# Patient Record
Sex: Female | Born: 1960 | Race: Black or African American | Hispanic: No | Marital: Married | Smoking: Never smoker
Health system: Southern US, Community
[De-identification: ages and names within clinical notes are randomized; demographics above are authoritative.]

## PROBLEM LIST (undated history)

## (undated) DIAGNOSIS — T7840XA Allergy, unspecified, initial encounter: Secondary | ICD-10-CM

## (undated) DIAGNOSIS — I10 Essential (primary) hypertension: Secondary | ICD-10-CM

## (undated) HISTORY — PX: HYSTEROSCOPY: SHX211

## (undated) HISTORY — PX: COLONOSCOPY: SHX174

## (undated) HISTORY — DX: Allergy, unspecified, initial encounter: T78.40XA

## (undated) HISTORY — DX: Essential (primary) hypertension: I10

---

## 1998-06-30 ENCOUNTER — Other Ambulatory Visit: Admission: RE | Admit: 1998-06-30 | Discharge: 1998-06-30 | Payer: Self-pay | Admitting: Gynecology

## 1999-09-23 ENCOUNTER — Other Ambulatory Visit: Admission: RE | Admit: 1999-09-23 | Discharge: 1999-09-23 | Payer: Self-pay | Admitting: Gynecology

## 2000-11-26 ENCOUNTER — Other Ambulatory Visit: Admission: RE | Admit: 2000-11-26 | Discharge: 2000-11-26 | Payer: Self-pay | Admitting: Gynecology

## 2002-02-10 ENCOUNTER — Other Ambulatory Visit: Admission: RE | Admit: 2002-02-10 | Discharge: 2002-02-10 | Payer: Self-pay | Admitting: Gynecology

## 2002-03-19 ENCOUNTER — Encounter: Admission: RE | Admit: 2002-03-19 | Discharge: 2002-03-19 | Payer: Self-pay | Admitting: Gynecology

## 2002-03-19 ENCOUNTER — Encounter: Payer: Self-pay | Admitting: Gynecology

## 2003-04-28 ENCOUNTER — Other Ambulatory Visit: Admission: RE | Admit: 2003-04-28 | Discharge: 2003-04-28 | Payer: Self-pay | Admitting: Gynecology

## 2003-12-07 ENCOUNTER — Emergency Department (HOSPITAL_COMMUNITY): Admission: EM | Admit: 2003-12-07 | Discharge: 2003-12-07 | Payer: Self-pay | Admitting: Emergency Medicine

## 2004-05-03 ENCOUNTER — Other Ambulatory Visit: Admission: RE | Admit: 2004-05-03 | Discharge: 2004-05-03 | Payer: Self-pay | Admitting: Gynecology

## 2004-11-21 ENCOUNTER — Encounter: Admission: RE | Admit: 2004-11-21 | Discharge: 2004-11-21 | Payer: Self-pay | Admitting: Internal Medicine

## 2004-11-25 ENCOUNTER — Ambulatory Visit (HOSPITAL_COMMUNITY): Admission: RE | Admit: 2004-11-25 | Discharge: 2004-11-25 | Payer: Self-pay | Admitting: Gynecology

## 2005-11-13 ENCOUNTER — Other Ambulatory Visit: Admission: RE | Admit: 2005-11-13 | Discharge: 2005-11-13 | Payer: Self-pay | Admitting: Gynecology

## 2005-11-30 ENCOUNTER — Encounter: Admission: RE | Admit: 2005-11-30 | Discharge: 2005-11-30 | Payer: Self-pay | Admitting: Gynecology

## 2006-09-20 ENCOUNTER — Encounter: Admission: RE | Admit: 2006-09-20 | Discharge: 2006-09-20 | Payer: Self-pay | Admitting: Orthopedic Surgery

## 2007-01-20 ENCOUNTER — Emergency Department (HOSPITAL_COMMUNITY): Admission: EM | Admit: 2007-01-20 | Discharge: 2007-01-20 | Payer: Self-pay | Admitting: Emergency Medicine

## 2007-03-14 ENCOUNTER — Encounter: Admission: RE | Admit: 2007-03-14 | Discharge: 2007-03-14 | Payer: Self-pay | Admitting: Gynecology

## 2007-06-10 ENCOUNTER — Other Ambulatory Visit: Admission: RE | Admit: 2007-06-10 | Discharge: 2007-06-10 | Payer: Self-pay | Admitting: Gynecology

## 2007-09-19 ENCOUNTER — Encounter (INDEPENDENT_AMBULATORY_CARE_PROVIDER_SITE_OTHER): Payer: Self-pay | Admitting: *Deleted

## 2007-09-19 ENCOUNTER — Ambulatory Visit (HOSPITAL_COMMUNITY): Admission: RE | Admit: 2007-09-19 | Discharge: 2007-09-19 | Payer: Self-pay | Admitting: *Deleted

## 2008-05-01 ENCOUNTER — Encounter: Admission: RE | Admit: 2008-05-01 | Discharge: 2008-05-01 | Payer: Self-pay | Admitting: Gynecology

## 2008-06-10 ENCOUNTER — Other Ambulatory Visit: Admission: RE | Admit: 2008-06-10 | Discharge: 2008-06-10 | Payer: Self-pay | Admitting: Gynecology

## 2009-08-27 ENCOUNTER — Emergency Department (HOSPITAL_COMMUNITY): Admission: EM | Admit: 2009-08-27 | Discharge: 2009-08-27 | Payer: Self-pay | Admitting: Family Medicine

## 2010-01-16 ENCOUNTER — Emergency Department (HOSPITAL_COMMUNITY): Admission: EM | Admit: 2010-01-16 | Discharge: 2010-01-16 | Payer: Self-pay | Admitting: Emergency Medicine

## 2010-10-07 ENCOUNTER — Encounter (INDEPENDENT_AMBULATORY_CARE_PROVIDER_SITE_OTHER): Payer: Self-pay | Admitting: *Deleted

## 2010-10-13 ENCOUNTER — Ambulatory Visit: Payer: Self-pay | Admitting: Internal Medicine

## 2010-10-25 ENCOUNTER — Ambulatory Visit: Payer: Self-pay | Admitting: Internal Medicine

## 2010-12-25 ENCOUNTER — Encounter: Payer: Self-pay | Admitting: General Surgery

## 2011-01-03 NOTE — Letter (Signed)
Summary: [facility] Instructions  [facility]  [address] [place], [place] [address]   Phone: [phone]  Fax: [phone]       Denise Shelton    January 13, 1961    MRN: [id number]        Procedure Day /Date: Tuesday 10-25-10     Arrival Time: 2:00 p.m.     Procedure Time: 3:00 p.m.     Location of Procedure:                    _x _  [facility] (4th Floor)                       PREPARATION FOR COLONOSCOPY WITH MOVIPREP   Starting 5 days prior to your procedure  10-20-10 do not eat nuts, seeds, popcorn, corn, beans, peas,  salads, or any raw vegetables.  Do not take any fiber supplements (e.g. Metamucil, Citrucel, and Benefiber).  THE DAY BEFORE YOUR PROCEDURE         DATE:  10-24-10   DAY:  Monday  1.  Drink clear liquids the entire day-NO SOLID FOOD  2.  Do not drink anything colored red or purple.  Avoid juices with pulp.  No orange juice.  3.  Drink at least 64 oz. (8 glasses) of fluid/clear liquids during the day to prevent dehydration and help the prep work efficiently.  CLEAR LIQUIDS INCLUDE: Water Jello Ice Popsicles Tea (sugar ok, no milk/cream) Powdered fruit flavored drinks Coffee (sugar ok, no milk/cream) Gatorade Juice: apple, white grape, white cranberry  Lemonade Clear bullion, consomm, broth Carbonated beverages (any kind) Strained chicken noodle soup Hard Candy                             4.  In the morning, mix first dose of MoviPrep solution:    Empty 1 Pouch A and 1 Pouch B into the disposable container    Add lukewarm drinking water to the top line of the container. Mix to dissolve    Refrigerate (mixed solution should be used within 24 hrs)  5.  Begin drinking the prep at 5:00 p.m. The MoviPrep container is divided by 4 marks.   Every 15 minutes drink the solution down to the next mark (approximately 8 oz) until the full liter is complete.   6.  Follow completed prep with 16 oz of clear liquid of your choice  (Nothing red or purple).  Continue to drink clear liquids until bedtime.  7.  Before going to bed, mix second dose of MoviPrep solution:    Empty 1 Pouch A and 1 Pouch B into the disposable container    Add lukewarm drinking water to the top line of the container. Mix to dissolve    Refrigerate  THE DAY OF YOUR PROCEDURE      DATE:  10-25-10  DAY:  Tuesday  Beginning at  10:00 a.m. (5 hours before procedure):         1. Every 15 minutes, drink the solution down to the next mark (approx 8 oz) until the full liter is complete.  2. Follow completed prep with 16 oz. of clear liquid of your choice.    3. You may drink clear liquids until  1:00 p.m.  (2 HOURS BEFORE PROCEDURE).   MEDICATION INSTRUCTIONS  Unless otherwise instructed, you should take regular prescription medications with a small sip of water  as early as possible the morning of your procedure.           OTHER INSTRUCTIONS  You will need a responsible adult at least 52 years of age to accompany you and drive you home.   This person must remain in the waiting room during your procedure.  Wear loose fitting clothing that is easily removed.  Leave jewelry and other valuables at home.  However, you may wish to bring a book to read or  an iPod/MP3 player to listen to music as you wait for your procedure to start.  Remove all body piercing jewelry and leave at home.  Total time from sign-in until discharge is approximately 2-3 hours.  You should go home directly after your procedure and rest.  You can resume normal activities the  day after your procedure.  The day of your procedure you should not:   Drive   Make legal decisions   Operate machinery   Drink alcohol   Return to work  You will receive specific instructions about eating, activities and medications before you leave.    The above instructions have been reviewed and explained to me by   Clide Cliff, RN_____________________    I  fully understand and can verbalize these instructions _____________________________ Date _________

## 2011-01-03 NOTE — Miscellaneous (Signed)
Summary: DIR COL...AS.  Clinical Lists Changes  Medications: Added new medication of MOVIPREP 100 GM SOLR (PEG-KCL-NACL-NASULF-NA ASC-C) As directed - Signed Rx of MOVIPREP 100 GM SOLR (PEG-KCL-NACL-NASULF-NA ASC-C) As directed;  #1 x 0;  Signed;  Entered by: Clide Cliff RN;  Authorized by: Hilarie Fredrickson MD;  Method used: Electronically to [facility] # [id number]*, [address] [place], [place], [place]  [address], Ph: [phone] or [phone], Fax: [phone] Allergies: Added new allergy or adverse reaction of ULTRAM Observations: Added new observation of NKA: F (10/13/2010 16:19)    Prescriptions: MOVIPREP 100 GM SOLR (PEG-KCL-NACL-NASULF-NA ASC-C) As directed  #1 x 0   Entered by:   Clide Cliff RN   Authorized by:   Hilarie Fredrickson MD   Signed by:   Clide Cliff RN on 10/13/2010   Method used:   Electronically to        [id number] (retail)       [address]       [place], [place]  [address]       Ph: [phone] or [phone]       Fax: [phone]   RxID:   [id number]

## 2011-01-03 NOTE — Letter (Signed)
Summary: Pre Visit Letter Revised  [facility]  [address] [place], [place] [address]   Phone: [phone]  Fax: [phone]        10/07/2010 MRN: [id number] [facility] [place] [address] [place], [place]  [address]             Procedure Date:  10/25/2010   Welcome to the Gastroenterology Division at [facility].    You are scheduled to see a nurse for your pre-procedure visit on 10/13/2010 at 4:30PM on the 3rd floor at [facility], [address]  We ask that you try to arrive at our office 15 minutes prior to your appointment time to allow for check-in.  Please take a minute to review the attached form.  If you answer "Yes" to one or more of the questions on the first page, we ask that you call the person listed at your earliest opportunity.  If you answer "No" to all of the questions, please complete the rest of the form and bring it to your appointment.    Your nurse visit will consist of discussing your medical and surgical history, your immediate family medical history, and your medications.   If you are unable to list all of your medications on the form, please bring the medication bottles to your appointment and we will list them.  We will need to be aware of both prescribed and over the counter drugs.  We will need to know exact dosage information as well.    Please be prepared to read and sign documents such as consent forms, a financial agreement, and acknowledgement forms.  If necessary, and with your consent, a friend or relative is welcome to sit-in on the nurse visit with you.  Please bring your insurance card so that we may make a copy of it.  If your insurance requires a referral to see a specialist, please bring your referral form from your primary care physician.  No co-pay is required for this nurse visit.     If you cannot keep your appointment, please call [phone] to cancel or reschedule prior to your appointment date.  This allows  [place] the opportunity to schedule an appointment for another patient in need of care.    Thank you for choosing [facility] for your medical needs.  We appreciate the opportunity to care for you.  Please visit [place] at our website  to learn more about our practice.  Sincerely, The Gastroenterology Division

## 2011-01-03 NOTE — Procedures (Signed)
Summary: Colonoscopy  Patient: [name] Note: All result statuses are Final unless otherwise noted.  Tests: (1) Colonoscopy (COL)   COL Colonoscopy           DONE     [facility]     [address]     [place], [place]  [address]           COLONOSCOPY PROCEDURE REPORT           PATIENT:  Denise, Shelton  MR#:  [id number]     BIRTHDATE:  1961/11/17, 49 yrs. old  GENDER:  female     ENDOSCOPIST:  Wilhemina Bonito. Eda Keys, MD     REF. BY:  Creola Corn, M.D.     PROCEDURE DATE:  10/25/2010     PROCEDURE:  Average-risk screening colonoscopy     G0121     ASA CLASS:  Class II     INDICATIONS:  Routine Risk Screening     MEDICATIONS:   Fentanyl 50 mcg IV, Versed 7 mg IV, Benadryl 25 mg     IV           DESCRIPTION OF PROCEDURE:   After the risks benefits and     alternatives of the procedure were thoroughly explained, informed     consent was obtained.  Digital rectal exam was performed and     revealed no abnormalities.   The [id number] endoscope was     introduced through the anus and advanced to the cecum, which was     identified by both the appendix and ileocecal valve, without     limitations.Time to cecum = 3:32 min.  The quality of the prep was     excellent, using MoviPrep.  The instrument was then slowly     withdrawn (time = 11:58 min) as the colon was fully examined.     <<PROCEDUREIMAGES>>           FINDINGS:  Scattered diverticula were found in the ascending     colon.  This was otherwise a normal examination of the colon.  No     polyps or cancers were seen.   Retroflexed views in the rectum     revealed no abnormalities.    The scope was then withdrawn from     the patient and the procedure completed.           COMPLICATIONS:  None           ENDOSCOPIC IMPRESSION:     1) Diverticula, scattered in the ascending colon     2) Otherwise normal examination     3) No polyps or cancers     RECOMMENDATIONS:     1) Continue current colorectal screening  recommendations for     "routine risk" patients with a repeat colonoscopy in 10 years.           ______________________________     Wilhemina Bonito. Eda Keys, MD           CC:  Creola Corn, MD;Howard Mezer, MD;The Patient           n.     Rosalie DoctorWilhemina Bonito. Eda Keys at 10/25/2010 03:46 PM           Florencia Reasons, [phone]  Note: An exclamation mark (!) indicates a result that was not dispersed into the flowsheet. Document Creation Date: 10/25/2010 3:47 PM _______________________________________________________________________  (1) Order result status: Final Collection or observation date-time: 10/25/2010 15:41 Requested  date-time:  Receipt date-time:  Reported date-time:  Referring Physician:   Ordering Physician: Fransico Setters [id number]) Specimen Source:  Source: Launa Grill Order Number: [id number] Lab site:   Appended Document: Colonoscopy    Clinical Lists Changes  Observations: Added new observation of COLONNXTDUE: 10/2020 (10/26/2010 7:23)

## 2011-04-18 NOTE — Op Note (Signed)
Denise Shelton, Denise Shelton                 ACCOUNT NO.:  [id number]   MEDICAL RECORD NO.:  [id number]          PATIENT TYPE:  AMB   LOCATION:  SDC                           FACILITY:  [facility]   PHYSICIAN:  Almedia Balls. Fore, M.D.   DATE OF BIRTH:  1961/07/22   DATE OF PROCEDURE:  09/19/2007  DATE OF DISCHARGE:                               OPERATIVE REPORT   PREOPERATIVE DIAGNOSIS:  Abnormal uterine bleeding, uterine enlargement,  question endometrial polyp.   POSTOPERATIVE DIAGNOSIS:  Abnormal uterine bleeding, uterine  enlargement, question endometrial polyp, pending pathology.   OPERATIONS:  Diagnostic hysteroscopy, fractional D&C.   ANESTHESIA:  MAC, with 10 mL 1% lidocaine paracervical block.   OPERATOR:  Almedia Balls. Randell Patient, M.D.   INDICATIONS FOR SURGERY:  This patient is a 50 year old with the above-  noted problems who has been counseled by Dr. Chevis Pretty and myself to have  the surgery noted above.  She has been fully counseled as to the nature  of the procedure and the risks involved, to include risks of anesthesia,  injury to uterus, ovaries, bowel, bladder, blood vessels, ureters,  postoperative hemorrhage, infection, and recuperation.  She fully  understands all these considerations and has signed informed consent to  proceed on September 19, 2007.   OPERATIVE FINDINGS:  On bimanual exam, the uterus was midposterior  approximately, 14-[redacted] weeks gestational size.  It was irregular.  There  were no palpable adnexal masses.  On hysteroscopy, the uterus sounded to  12.5 cm.  The endocervical canal had a small amount of shaggy tissue  present.  The endometrial cavity was irregular, with probable submucous  myomata and several shaggy appearing areas suggestive of polypoid  formation.   PROCEDURE:  With the patient under sedation, prepared and draped in  usual sterile fashion, a speculum was placed in the vagina.  The  anterior lip of the cervix was grasped with a single-tooth tenaculum,  and a  solution of 1% lidocaine was injected at the 2, 4, 8, and 10  o'clock positions for a total of 10 mL for paracervical block.  A small  sharp curette was used for curettage of the endocervical canal, with a  small amount of normal-appearing tissue being obtained.  The uterus was  sounded, as noted above, and the cervix was dilated up to a #21 Pratt  dilator.  Diagnostic hysteroscope was introduced using free flow of  [place] and direct vision, with the above-noted findings.  Hysteroscope  was removed, and a medium sharp curette and polyp forceps were used for  removal of tissue from the endometrial cavity.  The hysteroscope was re-  employed to ensure that all tissue had been removed.  After noting that  this was the case and that sponge and instrument counts were correct,  and hemostasis maintained, the procedure was terminated.  Estimated  blood loss:  Less than 25 mL.  The patient was taken to the recovery  room in good condition.   FOLLOW-UP CARE:  She is to return the office in approximately 2 weeks  for follow-up, and to call if heavy  bleeding, pain, or unexplained fever  should ensue.           ______________________________  Almedia Balls. Randell Patient, M.D.     SRF/MEDQ  D:  09/19/2007  T:  09/20/2007  Job:  [id number]   cc:   Leatha Gilding. Mezer, M.D.  Fax: [phone]

## 2011-08-02 ENCOUNTER — Other Ambulatory Visit: Payer: Self-pay | Admitting: Gynecology

## 2011-09-13 LAB — CBC
HCT: 37.2
MCHC: 34.2
MCV: 91.9
Platelets: 271
WBC: 7.5

## 2012-10-01 ENCOUNTER — Other Ambulatory Visit: Payer: Self-pay | Admitting: Gynecology

## 2013-10-21 ENCOUNTER — Other Ambulatory Visit: Payer: Self-pay | Admitting: Gynecology

## 2014-10-22 ENCOUNTER — Other Ambulatory Visit: Payer: Self-pay | Admitting: Gynecology

## 2014-10-27 LAB — CYTOLOGY - PAP

## 2015-02-03 ENCOUNTER — Other Ambulatory Visit: Payer: Self-pay | Admitting: Gynecology

## 2015-11-11 ENCOUNTER — Other Ambulatory Visit: Payer: Self-pay | Admitting: Obstetrics and Gynecology

## 2015-11-15 LAB — CYTOLOGY - PAP

## 2016-01-06 DIAGNOSIS — M5417 Radiculopathy, lumbosacral region: Secondary | ICD-10-CM | POA: Diagnosis not present

## 2016-01-06 DIAGNOSIS — M545 Low back pain: Secondary | ICD-10-CM | POA: Diagnosis not present

## 2016-01-23 ENCOUNTER — Ambulatory Visit (INDEPENDENT_AMBULATORY_CARE_PROVIDER_SITE_OTHER): Payer: 59 | Admitting: Physician Assistant

## 2016-01-23 VITALS — BP 116/68 | HR 84 | Temp 98.4°F | Resp 16 | Ht 65.5 in | Wt 145.0 lb

## 2016-01-23 DIAGNOSIS — M109 Gout, unspecified: Secondary | ICD-10-CM

## 2016-01-23 DIAGNOSIS — M1009 Idiopathic gout, multiple sites: Secondary | ICD-10-CM | POA: Diagnosis not present

## 2016-01-23 DIAGNOSIS — M7989 Other specified soft tissue disorders: Secondary | ICD-10-CM | POA: Diagnosis not present

## 2016-01-23 LAB — POCT CBC
Granulocyte percent: 50.5 %G (ref 37–80)
HCT, POC: 39.8 % (ref 37.7–47.9)
Hemoglobin: 13.5 g/dL (ref 12.2–16.2)
Lymph, poc: 2.4 (ref 0.6–3.4)
MCH: 30.7 pg (ref 27–31.2)
MCHC: 33.9 g/dL (ref 31.8–35.4)
MCV: 90.6 fL (ref 80–97)
MID (CBC): 0.2 (ref 0–0.9)
MPV: 7 fL (ref 0–99.8)
POC Granulocyte: 2.7 (ref 2–6.9)
POC LYMPH %: 44.8 % (ref 10–50)
POC MID %: 4.7 % (ref 0–12)
Platelet Count, POC: 263 10*3/uL (ref 142–424)
RBC: 4.39 M/uL (ref 4.04–5.48)
RDW, POC: 14.1 %
WBC: 5.3 10*3/uL (ref 4.6–10.2)

## 2016-01-23 LAB — GLUCOSE, POCT (MANUAL RESULT ENTRY): POC GLUCOSE: 93 mg/dL (ref 70–99)

## 2016-01-23 LAB — POCT SEDIMENTATION RATE: POCT SED RATE: 24 mm/hr — AB (ref 0–22)

## 2016-01-23 MED ORDER — PREDNISONE 20 MG PO TABS: ORAL_TABLET | ORAL | Status: DC

## 2016-01-23 NOTE — Patient Instructions (Addendum)
Limit fish and shellfish for now.  Limit red meat Stay hydrated. Urine should be pale yellow. Don't take aleve while on prednisone Prednisone 3 tabs for 3 days, 2 tabs for 3 days and then 1 tab for 3 days. I will call you with the results of your lab tests.   Gout Gout is an inflammatory arthritis caused by a buildup of uric acid crystals in the joints. Uric acid is a chemical that is normally present in the blood. When the level of uric acid in the blood is too high it can form crystals that deposit in your joints and tissues. This causes joint redness, soreness, and swelling (inflammation). Repeat attacks are common. Over time, uric acid crystals can form into masses (tophi) near a joint, destroying bone and causing disfigurement. Gout is treatable and often preventable. CAUSES  The disease begins with elevated levels of uric acid in the blood. Uric acid is produced by your body when it breaks down a naturally found substance called purines. Certain foods you eat, such as meats and fish, contain high amounts of purines. Causes of an elevated uric acid level include:  Being passed down from parent to child (heredity).  Diseases that cause increased uric acid production (such as obesity, psoriasis, and certain cancers).  Excessive alcohol use.  Diet, especially diets rich in meat and seafood.  Medicines, including certain cancer-fighting medicines (chemotherapy), water pills (diuretics), and aspirin.  Chronic kidney disease. The kidneys are no longer able to remove uric acid well.  Problems with metabolism. Conditions strongly associated with gout include:  Obesity.  High blood pressure.  High cholesterol.  Diabetes. Not everyone with elevated uric acid levels gets gout. It is not understood why some people get gout and others do not. Surgery, joint injury, and eating too much of certain foods are some of the factors that can lead to gout attacks. SYMPTOMS   An attack of gout  comes on quickly. It causes intense pain with redness, swelling, and warmth in a joint.  Fever can occur.  Often, only one joint is involved. Certain joints are more commonly involved:  Base of the big toe.  Knee.  Ankle.  Wrist.  Finger. Without treatment, an attack usually goes away in a few days to weeks. Between attacks, you usually will not have symptoms, which is different from many other forms of arthritis. DIAGNOSIS  Your caregiver will suspect gout based on your symptoms and exam. In some cases, tests may be recommended. The tests may include:  Blood tests.  Urine tests.  X-rays.  Joint fluid exam. This exam requires a needle to remove fluid from the joint (arthrocentesis). Using a microscope, gout is confirmed when uric acid crystals are seen in the joint fluid. TREATMENT  There are two phases to gout treatment: treating the sudden onset (acute) attack and preventing attacks (prophylaxis).  Treatment of an Acute Attack.  Medicines are used. These include anti-inflammatory medicines or steroid medicines.  An injection of steroid medicine into the affected joint is sometimes necessary.  The painful joint is rested. Movement can worsen the arthritis.  You may use warm or cold treatments on painful joints, depending which works best for you.  Treatment to Prevent Attacks.  If you suffer from frequent gout attacks, your caregiver may advise preventive medicine. These medicines are started after the acute attack subsides. These medicines either help your kidneys eliminate uric acid from your body or decrease your uric acid production. You may need to stay on  these medicines for a very long time.  The early phase of treatment with preventive medicine can be associated with an increase in acute gout attacks. For this reason, during the first few months of treatment, your caregiver may also advise you to take medicines usually used for acute gout treatment. Be sure you  understand your caregiver's directions. Your caregiver may make several adjustments to your medicine dose before these medicines are effective.  Discuss dietary treatment with your caregiver or dietitian. Alcohol and drinks high in sugar and fructose and foods such as meat, poultry, and seafood can increase uric acid levels. Your caregiver or dietitian can advise you on drinks and foods that should be limited. HOME CARE INSTRUCTIONS   Do not take aspirin to relieve pain. This raises uric acid levels.  Only take over-the-counter or prescription medicines for pain, discomfort, or fever as directed by your caregiver.  Rest the joint as much as possible. When in bed, keep sheets and blankets off painful areas.  Keep the affected joint raised (elevated).  Apply warm or cold treatments to painful joints. Use of warm or cold treatments depends on which works best for you.  Use crutches if the painful joint is in your leg.  Drink enough fluids to keep your urine clear or pale yellow. This helps your body get rid of uric acid. Limit alcohol, sugary drinks, and fructose drinks.  Follow your dietary instructions. Pay careful attention to the amount of protein you eat. Your daily diet should emphasize fruits, vegetables, whole grains, and fat-free or low-fat milk products. Discuss the use of coffee, vitamin C, and cherries with your caregiver or dietitian. These may be helpful in lowering uric acid levels.  Maintain a healthy body weight. SEEK MEDICAL CARE IF:   You develop diarrhea, vomiting, or any side effects from medicines.  You do not feel better in 24 hours, or you are getting worse. SEEK IMMEDIATE MEDICAL CARE IF:   Your joint becomes suddenly more tender, and you have chills or a fever. MAKE SURE YOU:   Understand these instructions.  Will watch your condition.  Will get help right away if you are not doing well or get worse.   This information is not intended to replace advice  given to you by your health care provider. Make sure you discuss any questions you have with your health care provider.   Document Released: 11/17/2000 Document Revised: 12/11/2014 Document Reviewed: 07/03/2012 Elsevier Interactive Patient Education [employer].

## 2016-01-23 NOTE — Progress Notes (Signed)
[facility] [address] [place] [address] [phone]  Date:  01/23/2016   Name:  Denise Shelton   DOB:  04-Aug-1961   MRN:  [id number]  PCP:  No primary care provider on file.    Chief Complaint: Hand Pain   History of Present Illness:  This is a 55 y.o. female who is presenting with right index finger pain and swelling x 24 hours. Pain over PIP joint of that finger. Finding difficult to flex d/t pain and tightness. Denies fever or chills. Never had anything like this happen before. Does not have a hx of gout although her mother has a hx of gout. Pt states she eats very healthy. Did have more shellfish than normal this past week. She does not eat red meat. She does not drink alcohol. No trauma.  She tried ibuprofen for the pain and was minimally effective.  Review of Systems:  Review of Systems See HPI  There are no active problems to display for this patient.   Prior to Admission medications   Medication Sig Start Date End Date Taking? Authorizing Provider  Naproxen Sodium (ALEVE PO) Take by mouth.   Yes Historical Provider, MD    Allergies  Allergen Reactions  . Latex   . Tramadol Hcl     REACTION: Nausea and Dizziness    Past Surgical History  Procedure Laterality Date  . Cesarean section      Social History  Substance Use Topics  . Smoking status: Never Smoker   . Smokeless tobacco: None  . Alcohol Use: None    Family History  Problem Relation Age of Onset  . Diabetes Mother   . Hypertension Mother   . Diabetes Father   . Hypertension Father   . Hyperlipidemia Father     Medication list has been reviewed and updated.  Physical Examination:  Physical Exam  Constitutional: She is oriented to person, place, and time. She appears well-developed and well-nourished. No distress.  HENT:  Head: Normocephalic and atraumatic.  Right Ear: Hearing normal.  Left Ear: Hearing normal.  Nose: Nose normal.  Eyes: Conjunctivae and lids  are normal. Right eye exhibits no discharge. Left eye exhibits no discharge. No scleral icterus.  Cardiovascular: Normal rate, regular rhythm, normal heart sounds and normal pulses.   No murmur heard. Pulmonary/Chest: Effort normal and breath sounds normal. No respiratory distress. She has no wheezes. She has no rhonchi. She has no rales.  Musculoskeletal:       Right hand: She exhibits decreased range of motion (mildly decreased flexion of PIP of index finger), tenderness (index finger, PIP), bony tenderness and swelling. She exhibits normal capillary refill. Normal sensation noted. Normal strength noted.       Left hand: Normal.  Erythema over PIP of index finger. No pain with light touch Mild warmth  Neurological: She is alert and oriented to person, place, and time.  Skin: Skin is warm, dry and intact.  Psychiatric: She has a normal mood and affect. Her speech is normal and behavior is normal. Thought content normal.    BP 116/68 mmHg  Pulse 84  Temp(Src) 98.4 F (36.9 C)  Resp 16  Ht 5' 5.5" (1.664 m)  Wt 145 lb (65.772 kg)  BMI 23.75 kg/m2  Results for orders placed or performed in visit on 01/23/16  POCT CBC  Result Value Ref Range   WBC 5.3 4.6 - 10.2 K/uL   Lymph, poc 2.4 0.6 - 3.4  POC LYMPH PERCENT 44.8 10 - 50 %L   MID (cbc) 0.2 0 - 0.9   POC MID % 4.7 0 - 12 %M   POC Granulocyte 2.7 2 - 6.9   Granulocyte percent 50.5 37 - 80 %G   RBC 4.39 4.04 - 5.48 M/uL   Hemoglobin 13.5 12.2 - 16.2 g/dL   HCT, POC 39.8 37.7 - 47.9 %   MCV 90.6 80 - 97 fL   MCH, POC 30.7 27 - 31.2 pg   MCHC 33.9 31.8 - 35.4 g/dL   RDW, POC 14.1 %   Platelet Count, POC 263 142 - 424 K/uL   MPV 7.0 0 - 99.8 fL   Assessment and Plan:  1. Finger swelling 2. Gout  Suspect gout as cause. CBC wnl. Sed rate and uric acid pending. No hx DM and glucose wnl. Treat with prednisone taper. We discussed diet changes. Sounds like gout could be hereditary. Runs in family. Return in 7-10 days if symptoms  do not improve or at any time if symptoms worsen.  - POCT CBC - POCT SEDIMENTATION RATE - Uric Acid - POCT glucose (manual entry) - predniSONE (DELTASONE) 20 MG tablet; Take 3 PO QAM x3days, 2 PO QAM x3days, 1 PO QAM x3days  Dispense: 18 tablet; Refill: 0   Benjaman Pott. Drenda Freeze, MHS [facility]  01/23/2016

## 2016-01-24 LAB — URIC ACID: URIC ACID, SERUM: 3.6 mg/dL (ref 2.4–7.0)

## 2016-02-10 DIAGNOSIS — M9901 Segmental and somatic dysfunction of cervical region: Secondary | ICD-10-CM | POA: Diagnosis not present

## 2016-02-10 DIAGNOSIS — Q72812 Congenital shortening of left lower limb: Secondary | ICD-10-CM | POA: Diagnosis not present

## 2016-02-10 DIAGNOSIS — M9905 Segmental and somatic dysfunction of pelvic region: Secondary | ICD-10-CM | POA: Diagnosis not present

## 2016-02-10 DIAGNOSIS — M50321 Other cervical disc degeneration at C4-C5 level: Secondary | ICD-10-CM | POA: Diagnosis not present

## 2016-02-10 DIAGNOSIS — M9903 Segmental and somatic dysfunction of lumbar region: Secondary | ICD-10-CM | POA: Diagnosis not present

## 2016-02-10 DIAGNOSIS — M9904 Segmental and somatic dysfunction of sacral region: Secondary | ICD-10-CM | POA: Diagnosis not present

## 2016-02-10 DIAGNOSIS — M4316 Spondylolisthesis, lumbar region: Secondary | ICD-10-CM | POA: Diagnosis not present

## 2016-02-10 DIAGNOSIS — M9902 Segmental and somatic dysfunction of thoracic region: Secondary | ICD-10-CM | POA: Diagnosis not present

## 2016-02-10 DIAGNOSIS — M4003 Postural kyphosis, cervicothoracic region: Secondary | ICD-10-CM | POA: Diagnosis not present

## 2016-02-17 DIAGNOSIS — M4003 Postural kyphosis, cervicothoracic region: Secondary | ICD-10-CM | POA: Diagnosis not present

## 2016-02-17 DIAGNOSIS — Q72812 Congenital shortening of left lower limb: Secondary | ICD-10-CM | POA: Diagnosis not present

## 2016-02-17 DIAGNOSIS — M9905 Segmental and somatic dysfunction of pelvic region: Secondary | ICD-10-CM | POA: Diagnosis not present

## 2016-02-17 DIAGNOSIS — M9903 Segmental and somatic dysfunction of lumbar region: Secondary | ICD-10-CM | POA: Diagnosis not present

## 2016-02-17 DIAGNOSIS — M50321 Other cervical disc degeneration at C4-C5 level: Secondary | ICD-10-CM | POA: Diagnosis not present

## 2016-02-17 DIAGNOSIS — M9901 Segmental and somatic dysfunction of cervical region: Secondary | ICD-10-CM | POA: Diagnosis not present

## 2016-02-17 DIAGNOSIS — M9904 Segmental and somatic dysfunction of sacral region: Secondary | ICD-10-CM | POA: Diagnosis not present

## 2016-02-17 DIAGNOSIS — M9902 Segmental and somatic dysfunction of thoracic region: Secondary | ICD-10-CM | POA: Diagnosis not present

## 2016-02-17 DIAGNOSIS — M4316 Spondylolisthesis, lumbar region: Secondary | ICD-10-CM | POA: Diagnosis not present

## 2016-02-21 DIAGNOSIS — M9902 Segmental and somatic dysfunction of thoracic region: Secondary | ICD-10-CM | POA: Diagnosis not present

## 2016-02-21 DIAGNOSIS — M4316 Spondylolisthesis, lumbar region: Secondary | ICD-10-CM | POA: Diagnosis not present

## 2016-02-21 DIAGNOSIS — M9903 Segmental and somatic dysfunction of lumbar region: Secondary | ICD-10-CM | POA: Diagnosis not present

## 2016-02-21 DIAGNOSIS — Q72812 Congenital shortening of left lower limb: Secondary | ICD-10-CM | POA: Diagnosis not present

## 2016-02-21 DIAGNOSIS — M9904 Segmental and somatic dysfunction of sacral region: Secondary | ICD-10-CM | POA: Diagnosis not present

## 2016-02-21 DIAGNOSIS — M4003 Postural kyphosis, cervicothoracic region: Secondary | ICD-10-CM | POA: Diagnosis not present

## 2016-02-21 DIAGNOSIS — M9905 Segmental and somatic dysfunction of pelvic region: Secondary | ICD-10-CM | POA: Diagnosis not present

## 2016-02-21 DIAGNOSIS — M9901 Segmental and somatic dysfunction of cervical region: Secondary | ICD-10-CM | POA: Diagnosis not present

## 2016-02-21 DIAGNOSIS — M50321 Other cervical disc degeneration at C4-C5 level: Secondary | ICD-10-CM | POA: Diagnosis not present

## 2016-02-24 DIAGNOSIS — M9903 Segmental and somatic dysfunction of lumbar region: Secondary | ICD-10-CM | POA: Diagnosis not present

## 2016-02-24 DIAGNOSIS — M4316 Spondylolisthesis, lumbar region: Secondary | ICD-10-CM | POA: Diagnosis not present

## 2016-02-24 DIAGNOSIS — M9902 Segmental and somatic dysfunction of thoracic region: Secondary | ICD-10-CM | POA: Diagnosis not present

## 2016-02-24 DIAGNOSIS — M50321 Other cervical disc degeneration at C4-C5 level: Secondary | ICD-10-CM | POA: Diagnosis not present

## 2016-02-24 DIAGNOSIS — M9905 Segmental and somatic dysfunction of pelvic region: Secondary | ICD-10-CM | POA: Diagnosis not present

## 2016-02-24 DIAGNOSIS — M9904 Segmental and somatic dysfunction of sacral region: Secondary | ICD-10-CM | POA: Diagnosis not present

## 2016-02-24 DIAGNOSIS — M9901 Segmental and somatic dysfunction of cervical region: Secondary | ICD-10-CM | POA: Diagnosis not present

## 2016-02-24 DIAGNOSIS — M4003 Postural kyphosis, cervicothoracic region: Secondary | ICD-10-CM | POA: Diagnosis not present

## 2016-02-24 DIAGNOSIS — Q72812 Congenital shortening of left lower limb: Secondary | ICD-10-CM | POA: Diagnosis not present

## 2016-02-28 DIAGNOSIS — M50321 Other cervical disc degeneration at C4-C5 level: Secondary | ICD-10-CM | POA: Diagnosis not present

## 2016-02-28 DIAGNOSIS — M9901 Segmental and somatic dysfunction of cervical region: Secondary | ICD-10-CM | POA: Diagnosis not present

## 2016-02-28 DIAGNOSIS — M9904 Segmental and somatic dysfunction of sacral region: Secondary | ICD-10-CM | POA: Diagnosis not present

## 2016-02-28 DIAGNOSIS — Q72812 Congenital shortening of left lower limb: Secondary | ICD-10-CM | POA: Diagnosis not present

## 2016-02-28 DIAGNOSIS — M4003 Postural kyphosis, cervicothoracic region: Secondary | ICD-10-CM | POA: Diagnosis not present

## 2016-02-28 DIAGNOSIS — M9905 Segmental and somatic dysfunction of pelvic region: Secondary | ICD-10-CM | POA: Diagnosis not present

## 2016-02-28 DIAGNOSIS — M4316 Spondylolisthesis, lumbar region: Secondary | ICD-10-CM | POA: Diagnosis not present

## 2016-02-28 DIAGNOSIS — M9903 Segmental and somatic dysfunction of lumbar region: Secondary | ICD-10-CM | POA: Diagnosis not present

## 2016-02-28 DIAGNOSIS — M9902 Segmental and somatic dysfunction of thoracic region: Secondary | ICD-10-CM | POA: Diagnosis not present

## 2016-03-02 DIAGNOSIS — M9902 Segmental and somatic dysfunction of thoracic region: Secondary | ICD-10-CM | POA: Diagnosis not present

## 2016-03-02 DIAGNOSIS — M9901 Segmental and somatic dysfunction of cervical region: Secondary | ICD-10-CM | POA: Diagnosis not present

## 2016-03-02 DIAGNOSIS — M4003 Postural kyphosis, cervicothoracic region: Secondary | ICD-10-CM | POA: Diagnosis not present

## 2016-03-02 DIAGNOSIS — M9905 Segmental and somatic dysfunction of pelvic region: Secondary | ICD-10-CM | POA: Diagnosis not present

## 2016-03-02 DIAGNOSIS — Q72812 Congenital shortening of left lower limb: Secondary | ICD-10-CM | POA: Diagnosis not present

## 2016-03-02 DIAGNOSIS — M50321 Other cervical disc degeneration at C4-C5 level: Secondary | ICD-10-CM | POA: Diagnosis not present

## 2016-03-02 DIAGNOSIS — M9904 Segmental and somatic dysfunction of sacral region: Secondary | ICD-10-CM | POA: Diagnosis not present

## 2016-03-02 DIAGNOSIS — M9903 Segmental and somatic dysfunction of lumbar region: Secondary | ICD-10-CM | POA: Diagnosis not present

## 2016-03-02 DIAGNOSIS — M4316 Spondylolisthesis, lumbar region: Secondary | ICD-10-CM | POA: Diagnosis not present

## 2016-03-08 DIAGNOSIS — M47812 Spondylosis without myelopathy or radiculopathy, cervical region: Secondary | ICD-10-CM | POA: Diagnosis not present

## 2016-03-08 DIAGNOSIS — M47816 Spondylosis without myelopathy or radiculopathy, lumbar region: Secondary | ICD-10-CM | POA: Diagnosis not present

## 2016-03-09 DIAGNOSIS — Q72812 Congenital shortening of left lower limb: Secondary | ICD-10-CM | POA: Diagnosis not present

## 2016-03-09 DIAGNOSIS — M4316 Spondylolisthesis, lumbar region: Secondary | ICD-10-CM | POA: Diagnosis not present

## 2016-03-09 DIAGNOSIS — M9901 Segmental and somatic dysfunction of cervical region: Secondary | ICD-10-CM | POA: Diagnosis not present

## 2016-03-09 DIAGNOSIS — M9905 Segmental and somatic dysfunction of pelvic region: Secondary | ICD-10-CM | POA: Diagnosis not present

## 2016-03-09 DIAGNOSIS — M9903 Segmental and somatic dysfunction of lumbar region: Secondary | ICD-10-CM | POA: Diagnosis not present

## 2016-03-09 DIAGNOSIS — M9902 Segmental and somatic dysfunction of thoracic region: Secondary | ICD-10-CM | POA: Diagnosis not present

## 2016-03-09 DIAGNOSIS — M50321 Other cervical disc degeneration at C4-C5 level: Secondary | ICD-10-CM | POA: Diagnosis not present

## 2016-03-09 DIAGNOSIS — M4003 Postural kyphosis, cervicothoracic region: Secondary | ICD-10-CM | POA: Diagnosis not present

## 2016-03-09 DIAGNOSIS — M9904 Segmental and somatic dysfunction of sacral region: Secondary | ICD-10-CM | POA: Diagnosis not present

## 2016-03-16 DIAGNOSIS — Q72812 Congenital shortening of left lower limb: Secondary | ICD-10-CM | POA: Diagnosis not present

## 2016-03-16 DIAGNOSIS — M9902 Segmental and somatic dysfunction of thoracic region: Secondary | ICD-10-CM | POA: Diagnosis not present

## 2016-03-16 DIAGNOSIS — M4316 Spondylolisthesis, lumbar region: Secondary | ICD-10-CM | POA: Diagnosis not present

## 2016-03-16 DIAGNOSIS — M9903 Segmental and somatic dysfunction of lumbar region: Secondary | ICD-10-CM | POA: Diagnosis not present

## 2016-03-16 DIAGNOSIS — M9904 Segmental and somatic dysfunction of sacral region: Secondary | ICD-10-CM | POA: Diagnosis not present

## 2016-03-16 DIAGNOSIS — M9901 Segmental and somatic dysfunction of cervical region: Secondary | ICD-10-CM | POA: Diagnosis not present

## 2016-03-16 DIAGNOSIS — M4003 Postural kyphosis, cervicothoracic region: Secondary | ICD-10-CM | POA: Diagnosis not present

## 2016-03-16 DIAGNOSIS — M9905 Segmental and somatic dysfunction of pelvic region: Secondary | ICD-10-CM | POA: Diagnosis not present

## 2016-03-16 DIAGNOSIS — M50321 Other cervical disc degeneration at C4-C5 level: Secondary | ICD-10-CM | POA: Diagnosis not present

## 2016-03-30 DIAGNOSIS — M9902 Segmental and somatic dysfunction of thoracic region: Secondary | ICD-10-CM | POA: Diagnosis not present

## 2016-03-30 DIAGNOSIS — M4003 Postural kyphosis, cervicothoracic region: Secondary | ICD-10-CM | POA: Diagnosis not present

## 2016-03-30 DIAGNOSIS — M9903 Segmental and somatic dysfunction of lumbar region: Secondary | ICD-10-CM | POA: Diagnosis not present

## 2016-03-30 DIAGNOSIS — M9905 Segmental and somatic dysfunction of pelvic region: Secondary | ICD-10-CM | POA: Diagnosis not present

## 2016-03-30 DIAGNOSIS — M9904 Segmental and somatic dysfunction of sacral region: Secondary | ICD-10-CM | POA: Diagnosis not present

## 2016-03-30 DIAGNOSIS — Q72812 Congenital shortening of left lower limb: Secondary | ICD-10-CM | POA: Diagnosis not present

## 2016-03-30 DIAGNOSIS — M50321 Other cervical disc degeneration at C4-C5 level: Secondary | ICD-10-CM | POA: Diagnosis not present

## 2016-03-30 DIAGNOSIS — M4316 Spondylolisthesis, lumbar region: Secondary | ICD-10-CM | POA: Diagnosis not present

## 2016-03-30 DIAGNOSIS — M9901 Segmental and somatic dysfunction of cervical region: Secondary | ICD-10-CM | POA: Diagnosis not present

## 2016-04-06 DIAGNOSIS — M9901 Segmental and somatic dysfunction of cervical region: Secondary | ICD-10-CM | POA: Diagnosis not present

## 2016-04-06 DIAGNOSIS — Q72812 Congenital shortening of left lower limb: Secondary | ICD-10-CM | POA: Diagnosis not present

## 2016-04-06 DIAGNOSIS — M9903 Segmental and somatic dysfunction of lumbar region: Secondary | ICD-10-CM | POA: Diagnosis not present

## 2016-04-06 DIAGNOSIS — M4316 Spondylolisthesis, lumbar region: Secondary | ICD-10-CM | POA: Diagnosis not present

## 2016-04-06 DIAGNOSIS — M50321 Other cervical disc degeneration at C4-C5 level: Secondary | ICD-10-CM | POA: Diagnosis not present

## 2016-04-06 DIAGNOSIS — M9902 Segmental and somatic dysfunction of thoracic region: Secondary | ICD-10-CM | POA: Diagnosis not present

## 2016-04-06 DIAGNOSIS — M9905 Segmental and somatic dysfunction of pelvic region: Secondary | ICD-10-CM | POA: Diagnosis not present

## 2016-04-06 DIAGNOSIS — M4003 Postural kyphosis, cervicothoracic region: Secondary | ICD-10-CM | POA: Diagnosis not present

## 2016-04-06 DIAGNOSIS — M9904 Segmental and somatic dysfunction of sacral region: Secondary | ICD-10-CM | POA: Diagnosis not present

## 2016-05-04 DIAGNOSIS — M9904 Segmental and somatic dysfunction of sacral region: Secondary | ICD-10-CM | POA: Diagnosis not present

## 2016-05-04 DIAGNOSIS — M4003 Postural kyphosis, cervicothoracic region: Secondary | ICD-10-CM | POA: Diagnosis not present

## 2016-05-04 DIAGNOSIS — M4316 Spondylolisthesis, lumbar region: Secondary | ICD-10-CM | POA: Diagnosis not present

## 2016-05-04 DIAGNOSIS — M9901 Segmental and somatic dysfunction of cervical region: Secondary | ICD-10-CM | POA: Diagnosis not present

## 2016-05-04 DIAGNOSIS — M9903 Segmental and somatic dysfunction of lumbar region: Secondary | ICD-10-CM | POA: Diagnosis not present

## 2016-05-04 DIAGNOSIS — Q72812 Congenital shortening of left lower limb: Secondary | ICD-10-CM | POA: Diagnosis not present

## 2016-05-04 DIAGNOSIS — M9905 Segmental and somatic dysfunction of pelvic region: Secondary | ICD-10-CM | POA: Diagnosis not present

## 2016-05-04 DIAGNOSIS — M50321 Other cervical disc degeneration at C4-C5 level: Secondary | ICD-10-CM | POA: Diagnosis not present

## 2016-05-04 DIAGNOSIS — M9902 Segmental and somatic dysfunction of thoracic region: Secondary | ICD-10-CM | POA: Diagnosis not present

## 2016-06-01 DIAGNOSIS — Q72812 Congenital shortening of left lower limb: Secondary | ICD-10-CM | POA: Diagnosis not present

## 2016-06-01 DIAGNOSIS — M50321 Other cervical disc degeneration at C4-C5 level: Secondary | ICD-10-CM | POA: Diagnosis not present

## 2016-06-01 DIAGNOSIS — M9903 Segmental and somatic dysfunction of lumbar region: Secondary | ICD-10-CM | POA: Diagnosis not present

## 2016-06-01 DIAGNOSIS — M9905 Segmental and somatic dysfunction of pelvic region: Secondary | ICD-10-CM | POA: Diagnosis not present

## 2016-06-01 DIAGNOSIS — M9904 Segmental and somatic dysfunction of sacral region: Secondary | ICD-10-CM | POA: Diagnosis not present

## 2016-06-01 DIAGNOSIS — M4316 Spondylolisthesis, lumbar region: Secondary | ICD-10-CM | POA: Diagnosis not present

## 2016-06-01 DIAGNOSIS — M9902 Segmental and somatic dysfunction of thoracic region: Secondary | ICD-10-CM | POA: Diagnosis not present

## 2016-06-01 DIAGNOSIS — M4003 Postural kyphosis, cervicothoracic region: Secondary | ICD-10-CM | POA: Diagnosis not present

## 2016-06-01 DIAGNOSIS — M9901 Segmental and somatic dysfunction of cervical region: Secondary | ICD-10-CM | POA: Diagnosis not present

## 2016-07-05 DIAGNOSIS — Q72812 Congenital shortening of left lower limb: Secondary | ICD-10-CM | POA: Diagnosis not present

## 2016-07-05 DIAGNOSIS — M9902 Segmental and somatic dysfunction of thoracic region: Secondary | ICD-10-CM | POA: Diagnosis not present

## 2016-07-05 DIAGNOSIS — M4316 Spondylolisthesis, lumbar region: Secondary | ICD-10-CM | POA: Diagnosis not present

## 2016-07-05 DIAGNOSIS — M50321 Other cervical disc degeneration at C4-C5 level: Secondary | ICD-10-CM | POA: Diagnosis not present

## 2016-07-05 DIAGNOSIS — M9901 Segmental and somatic dysfunction of cervical region: Secondary | ICD-10-CM | POA: Diagnosis not present

## 2016-07-05 DIAGNOSIS — M9905 Segmental and somatic dysfunction of pelvic region: Secondary | ICD-10-CM | POA: Diagnosis not present

## 2016-07-05 DIAGNOSIS — M4003 Postural kyphosis, cervicothoracic region: Secondary | ICD-10-CM | POA: Diagnosis not present

## 2016-07-05 DIAGNOSIS — M9904 Segmental and somatic dysfunction of sacral region: Secondary | ICD-10-CM | POA: Diagnosis not present

## 2016-07-05 DIAGNOSIS — M9903 Segmental and somatic dysfunction of lumbar region: Secondary | ICD-10-CM | POA: Diagnosis not present

## 2016-07-25 DIAGNOSIS — M4003 Postural kyphosis, cervicothoracic region: Secondary | ICD-10-CM | POA: Diagnosis not present

## 2016-07-25 DIAGNOSIS — M50321 Other cervical disc degeneration at C4-C5 level: Secondary | ICD-10-CM | POA: Diagnosis not present

## 2016-07-25 DIAGNOSIS — M9904 Segmental and somatic dysfunction of sacral region: Secondary | ICD-10-CM | POA: Diagnosis not present

## 2016-07-25 DIAGNOSIS — Q72812 Congenital shortening of left lower limb: Secondary | ICD-10-CM | POA: Diagnosis not present

## 2016-07-25 DIAGNOSIS — M9901 Segmental and somatic dysfunction of cervical region: Secondary | ICD-10-CM | POA: Diagnosis not present

## 2016-07-25 DIAGNOSIS — M9902 Segmental and somatic dysfunction of thoracic region: Secondary | ICD-10-CM | POA: Diagnosis not present

## 2016-07-25 DIAGNOSIS — M9905 Segmental and somatic dysfunction of pelvic region: Secondary | ICD-10-CM | POA: Diagnosis not present

## 2016-07-25 DIAGNOSIS — M9903 Segmental and somatic dysfunction of lumbar region: Secondary | ICD-10-CM | POA: Diagnosis not present

## 2016-07-25 DIAGNOSIS — M4316 Spondylolisthesis, lumbar region: Secondary | ICD-10-CM | POA: Diagnosis not present

## 2016-08-04 DIAGNOSIS — E784 Other hyperlipidemia: Secondary | ICD-10-CM | POA: Diagnosis not present

## 2016-08-04 DIAGNOSIS — I10 Essential (primary) hypertension: Secondary | ICD-10-CM | POA: Diagnosis not present

## 2016-08-04 DIAGNOSIS — Z0000 Encounter for general adult medical examination without abnormal findings: Secondary | ICD-10-CM | POA: Diagnosis not present

## 2016-08-11 DIAGNOSIS — D259 Leiomyoma of uterus, unspecified: Secondary | ICD-10-CM | POA: Diagnosis not present

## 2016-08-11 DIAGNOSIS — N951 Menopausal and female climacteric states: Secondary | ICD-10-CM | POA: Diagnosis not present

## 2016-08-11 DIAGNOSIS — M79662 Pain in left lower leg: Secondary | ICD-10-CM | POA: Diagnosis not present

## 2016-08-11 DIAGNOSIS — I10 Essential (primary) hypertension: Secondary | ICD-10-CM | POA: Diagnosis not present

## 2016-08-11 DIAGNOSIS — E784 Other hyperlipidemia: Secondary | ICD-10-CM | POA: Diagnosis not present

## 2016-08-11 DIAGNOSIS — M5416 Radiculopathy, lumbar region: Secondary | ICD-10-CM | POA: Diagnosis not present

## 2016-08-11 DIAGNOSIS — M67432 Ganglion, left wrist: Secondary | ICD-10-CM | POA: Diagnosis not present

## 2016-08-11 DIAGNOSIS — Z0000 Encounter for general adult medical examination without abnormal findings: Secondary | ICD-10-CM | POA: Diagnosis not present

## 2016-08-11 DIAGNOSIS — Z87448 Personal history of other diseases of urinary system: Secondary | ICD-10-CM | POA: Diagnosis not present

## 2016-08-16 DIAGNOSIS — Z1212 Encounter for screening for malignant neoplasm of rectum: Secondary | ICD-10-CM | POA: Diagnosis not present

## 2016-09-12 ENCOUNTER — Ambulatory Visit (INDEPENDENT_AMBULATORY_CARE_PROVIDER_SITE_OTHER): Payer: 59 | Admitting: Podiatry

## 2016-09-12 ENCOUNTER — Ambulatory Visit (INDEPENDENT_AMBULATORY_CARE_PROVIDER_SITE_OTHER): Payer: 59

## 2016-09-12 ENCOUNTER — Encounter: Payer: Self-pay | Admitting: Podiatry

## 2016-09-12 ENCOUNTER — Other Ambulatory Visit: Payer: Self-pay | Admitting: *Deleted

## 2016-09-12 ENCOUNTER — Ambulatory Visit: Payer: Self-pay

## 2016-09-12 VITALS — BP 150/85 | HR 62 | Resp 16

## 2016-09-12 DIAGNOSIS — M2010 Hallux valgus (acquired), unspecified foot: Secondary | ICD-10-CM

## 2016-09-12 DIAGNOSIS — M2011 Hallux valgus (acquired), right foot: Secondary | ICD-10-CM | POA: Diagnosis not present

## 2016-09-12 DIAGNOSIS — M2012 Hallux valgus (acquired), left foot: Secondary | ICD-10-CM

## 2016-09-12 DIAGNOSIS — Q828 Other specified congenital malformations of skin: Secondary | ICD-10-CM

## 2016-09-12 NOTE — Progress Notes (Signed)
   Subjective:    Patient ID: Denise Shelton, female    DOB: Mar 22, 1961, 55 y.o.   MRN: [id number]  HPI: Pain presents today as a 55 year old female with a chief complaint of pain first metatarsophalangeal joint left greater than right. She states that the deformity has been present for years seems to be getting worse and has really just started to ache in the past 3 months. She states that the second toes are beginning to overlap and crossover she says shoes are becoming uncomfortable and she has calloused areas to the plantar aspect of the second metatarsophalangeal joints.    Review of Systems  Musculoskeletal: Positive for arthralgias and back pain.  All other systems reviewed and are negative.      Objective:   Physical Exam: Vital signs are stable she is alert and oriented 3 pleasant female in no apparent distress. Pulses are strongly palpable. Neurologic sensorium is intact. Deep tendon reflexes are intact. Muscle strength +5 over 5 dorsiflexion plantar flexors and inverters everters all edges of musculature is intact. Orthopedic evaluation of his joints all joints of the ankle full range of motion without crepitus significant hallux abductovalgus bilateral with hammertoe deformities developing. She has some tenderness on palpation and range of motion of the second metatarsophalangeal joints (right. Radiographs taken today do demonstrate an increase in the first intermetatarsal angle greater than normal value with hallux abductus angle greater than normal value and early dislocation of the first metatarsophalangeal joint. Hammertoe deformities are noted on lateral view as well. Medial deviation of the toes also noted on AP view. Cutaneous evaluation does demonstrate solitary porokeratotic lesions beneath the second metatarsal phalangeal joint of the bilateral foot these lesions are sharply dictated porokeratotic. There's no signs of infection.        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  Plan: Discussed in great detail today the need for surgical intervention as well as a second metatarsal osteotomy and first metatarsal osteotomy and hammertoe repair with pin most likely. We discussed this in great detail today we also debrided all reactive hyperkeratosis on follow-up with her in 6 weeks at which time we will have a surgical consult.

## 2016-09-14 DIAGNOSIS — H6123 Impacted cerumen, bilateral: Secondary | ICD-10-CM | POA: Diagnosis not present

## 2016-09-14 DIAGNOSIS — H6093 Unspecified otitis externa, bilateral: Secondary | ICD-10-CM | POA: Diagnosis not present

## 2016-09-14 DIAGNOSIS — J309 Allergic rhinitis, unspecified: Secondary | ICD-10-CM | POA: Diagnosis not present

## 2016-09-14 DIAGNOSIS — H9190 Unspecified hearing loss, unspecified ear: Secondary | ICD-10-CM | POA: Diagnosis not present

## 2016-10-24 ENCOUNTER — Ambulatory Visit: Payer: 59 | Admitting: Podiatry

## 2016-11-16 DIAGNOSIS — Z1231 Encounter for screening mammogram for malignant neoplasm of breast: Secondary | ICD-10-CM | POA: Diagnosis not present

## 2016-11-16 DIAGNOSIS — Z01419 Encounter for gynecological examination (general) (routine) without abnormal findings: Secondary | ICD-10-CM | POA: Diagnosis not present

## 2017-01-04 DIAGNOSIS — N950 Postmenopausal bleeding: Secondary | ICD-10-CM | POA: Diagnosis not present

## 2017-02-09 DIAGNOSIS — Z6823 Body mass index (BMI) 23.0-23.9, adult: Secondary | ICD-10-CM | POA: Diagnosis not present

## 2017-02-09 DIAGNOSIS — M2012 Hallux valgus (acquired), left foot: Secondary | ICD-10-CM | POA: Diagnosis not present

## 2017-02-09 DIAGNOSIS — N951 Menopausal and female climacteric states: Secondary | ICD-10-CM | POA: Diagnosis not present

## 2017-02-09 DIAGNOSIS — I10 Essential (primary) hypertension: Secondary | ICD-10-CM | POA: Diagnosis not present

## 2017-02-09 DIAGNOSIS — J3089 Other allergic rhinitis: Secondary | ICD-10-CM | POA: Diagnosis not present

## 2017-08-10 DIAGNOSIS — Z0000 Encounter for general adult medical examination without abnormal findings: Secondary | ICD-10-CM | POA: Diagnosis not present

## 2017-08-10 DIAGNOSIS — E784 Other hyperlipidemia: Secondary | ICD-10-CM | POA: Diagnosis not present

## 2017-08-10 DIAGNOSIS — I10 Essential (primary) hypertension: Secondary | ICD-10-CM | POA: Diagnosis not present

## 2017-08-17 DIAGNOSIS — I10 Essential (primary) hypertension: Secondary | ICD-10-CM | POA: Diagnosis not present

## 2017-08-17 DIAGNOSIS — Z0000 Encounter for general adult medical examination without abnormal findings: Secondary | ICD-10-CM | POA: Diagnosis not present

## 2017-08-17 DIAGNOSIS — N951 Menopausal and female climacteric states: Secondary | ICD-10-CM | POA: Diagnosis not present

## 2017-08-17 DIAGNOSIS — M6740 Ganglion, unspecified site: Secondary | ICD-10-CM | POA: Diagnosis not present

## 2017-08-17 DIAGNOSIS — M79662 Pain in left lower leg: Secondary | ICD-10-CM | POA: Diagnosis not present

## 2017-08-17 DIAGNOSIS — Z87448 Personal history of other diseases of urinary system: Secondary | ICD-10-CM | POA: Diagnosis not present

## 2017-08-17 DIAGNOSIS — M2012 Hallux valgus (acquired), left foot: Secondary | ICD-10-CM | POA: Diagnosis not present

## 2017-08-17 DIAGNOSIS — E784 Other hyperlipidemia: Secondary | ICD-10-CM | POA: Diagnosis not present

## 2017-08-17 DIAGNOSIS — J3089 Other allergic rhinitis: Secondary | ICD-10-CM | POA: Diagnosis not present

## 2017-08-17 DIAGNOSIS — Z1389 Encounter for screening for other disorder: Secondary | ICD-10-CM | POA: Diagnosis not present

## 2017-08-24 DIAGNOSIS — Z1212 Encounter for screening for malignant neoplasm of rectum: Secondary | ICD-10-CM | POA: Diagnosis not present

## 2017-09-03 DIAGNOSIS — J029 Acute pharyngitis, unspecified: Secondary | ICD-10-CM | POA: Diagnosis not present

## 2017-09-03 DIAGNOSIS — Z6824 Body mass index (BMI) 24.0-24.9, adult: Secondary | ICD-10-CM | POA: Diagnosis not present

## 2017-09-03 DIAGNOSIS — J111 Influenza due to unidentified influenza virus with other respiratory manifestations: Secondary | ICD-10-CM | POA: Diagnosis not present

## 2017-09-03 DIAGNOSIS — J0100 Acute maxillary sinusitis, unspecified: Secondary | ICD-10-CM | POA: Diagnosis not present

## 2017-09-03 DIAGNOSIS — H6121 Impacted cerumen, right ear: Secondary | ICD-10-CM | POA: Diagnosis not present

## 2017-09-03 DIAGNOSIS — J020 Streptococcal pharyngitis: Secondary | ICD-10-CM | POA: Diagnosis not present

## 2017-11-28 DIAGNOSIS — I10 Essential (primary) hypertension: Secondary | ICD-10-CM | POA: Diagnosis not present

## 2017-11-28 DIAGNOSIS — J0100 Acute maxillary sinusitis, unspecified: Secondary | ICD-10-CM | POA: Diagnosis not present

## 2017-11-28 DIAGNOSIS — R05 Cough: Secondary | ICD-10-CM | POA: Diagnosis not present

## 2017-11-28 DIAGNOSIS — J309 Allergic rhinitis, unspecified: Secondary | ICD-10-CM | POA: Diagnosis not present

## 2017-11-28 DIAGNOSIS — Z6825 Body mass index (BMI) 25.0-25.9, adult: Secondary | ICD-10-CM | POA: Diagnosis not present

## 2018-02-20 DIAGNOSIS — Z124 Encounter for screening for malignant neoplasm of cervix: Secondary | ICD-10-CM | POA: Diagnosis not present

## 2018-02-20 DIAGNOSIS — Z1231 Encounter for screening mammogram for malignant neoplasm of breast: Secondary | ICD-10-CM | POA: Diagnosis not present

## 2018-02-20 DIAGNOSIS — Z01419 Encounter for gynecological examination (general) (routine) without abnormal findings: Secondary | ICD-10-CM | POA: Diagnosis not present

## 2018-03-06 DIAGNOSIS — R05 Cough: Secondary | ICD-10-CM | POA: Diagnosis not present

## 2018-07-10 DIAGNOSIS — H524 Presbyopia: Secondary | ICD-10-CM | POA: Diagnosis not present

## 2018-07-10 DIAGNOSIS — H52203 Unspecified astigmatism, bilateral: Secondary | ICD-10-CM | POA: Diagnosis not present

## 2018-07-10 DIAGNOSIS — H5213 Myopia, bilateral: Secondary | ICD-10-CM | POA: Diagnosis not present

## 2018-08-23 DIAGNOSIS — Z0000 Encounter for general adult medical examination without abnormal findings: Secondary | ICD-10-CM | POA: Diagnosis not present

## 2018-08-23 DIAGNOSIS — R82998 Other abnormal findings in urine: Secondary | ICD-10-CM | POA: Diagnosis not present

## 2018-08-23 DIAGNOSIS — E7849 Other hyperlipidemia: Secondary | ICD-10-CM | POA: Diagnosis not present

## 2018-08-23 DIAGNOSIS — I10 Essential (primary) hypertension: Secondary | ICD-10-CM | POA: Diagnosis not present

## 2018-08-30 DIAGNOSIS — Z6825 Body mass index (BMI) 25.0-25.9, adult: Secondary | ICD-10-CM | POA: Diagnosis not present

## 2018-08-30 DIAGNOSIS — I10 Essential (primary) hypertension: Secondary | ICD-10-CM | POA: Diagnosis not present

## 2018-08-30 DIAGNOSIS — Z1389 Encounter for screening for other disorder: Secondary | ICD-10-CM | POA: Diagnosis not present

## 2018-08-30 DIAGNOSIS — N951 Menopausal and female climacteric states: Secondary | ICD-10-CM | POA: Diagnosis not present

## 2018-08-30 DIAGNOSIS — E7849 Other hyperlipidemia: Secondary | ICD-10-CM | POA: Diagnosis not present

## 2018-08-30 DIAGNOSIS — Z0000 Encounter for general adult medical examination without abnormal findings: Secondary | ICD-10-CM | POA: Diagnosis not present

## 2018-08-30 DIAGNOSIS — J3089 Other allergic rhinitis: Secondary | ICD-10-CM | POA: Diagnosis not present

## 2018-08-30 DIAGNOSIS — Z87448 Personal history of other diseases of urinary system: Secondary | ICD-10-CM | POA: Diagnosis not present

## 2018-09-03 DIAGNOSIS — Z1212 Encounter for screening for malignant neoplasm of rectum: Secondary | ICD-10-CM | POA: Diagnosis not present

## 2019-03-14 ENCOUNTER — Emergency Department (HOSPITAL_COMMUNITY)
Admission: EM | Admit: 2019-03-14 | Discharge: 2019-03-14 | Disposition: A | Discharge status: Home / Self Care | Payer: 59 | Attending: Emergency Medicine | Admitting: Emergency Medicine

## 2019-03-14 ENCOUNTER — Encounter (HOSPITAL_COMMUNITY): Payer: Self-pay | Admitting: Emergency Medicine

## 2019-03-14 ENCOUNTER — Emergency Department (HOSPITAL_COMMUNITY): Payer: 59

## 2019-03-14 ENCOUNTER — Other Ambulatory Visit: Payer: Self-pay

## 2019-03-14 DIAGNOSIS — R200 Anesthesia of skin: Secondary | ICD-10-CM | POA: Diagnosis not present

## 2019-03-14 DIAGNOSIS — R51 Headache: Secondary | ICD-10-CM | POA: Insufficient documentation

## 2019-03-14 DIAGNOSIS — R519 Headache, unspecified: Secondary | ICD-10-CM

## 2019-03-14 DIAGNOSIS — H538 Other visual disturbances: Secondary | ICD-10-CM | POA: Insufficient documentation

## 2019-03-14 DIAGNOSIS — I10 Essential (primary) hypertension: Secondary | ICD-10-CM | POA: Insufficient documentation

## 2019-03-14 DIAGNOSIS — M25512 Pain in left shoulder: Secondary | ICD-10-CM | POA: Diagnosis not present

## 2019-03-14 DIAGNOSIS — R079 Chest pain, unspecified: Secondary | ICD-10-CM | POA: Diagnosis not present

## 2019-03-14 DIAGNOSIS — G43909 Migraine, unspecified, not intractable, without status migrainosus: Secondary | ICD-10-CM | POA: Diagnosis not present

## 2019-03-14 DIAGNOSIS — R6884 Jaw pain: Secondary | ICD-10-CM | POA: Diagnosis not present

## 2019-03-14 LAB — I-STAT BETA HCG BLOOD, ED (MC, WL, AP ONLY): I-stat hCG, quantitative: <5 m[IU]/mL (ref <5)

## 2019-03-14 LAB — CBC
HCT: 40.9 % (ref 36.0–46.0)
Hemoglobin: 13.3 g/dL (ref 12.0–15.0)
MCH: 29.4 pg (ref 26.0–34.0)
MCHC: 32.5 g/dL (ref 30.0–36.0)
MCV: 90.5 fL (ref 80.0–100.0)
Platelets: 234 10*3/uL (ref 150–400)
RBC: 4.52 MIL/uL (ref 3.87–5.11)
RDW: 13.2 % (ref 11.5–15.5)
WBC: 6.3 10*3/uL (ref 4.0–10.5)
nRBC: 0 % (ref 0.0–0.2)

## 2019-03-14 LAB — SEDIMENTATION RATE: Sed Rate: 30 mm/hr — ABNORMAL HIGH (ref 0–22)

## 2019-03-14 LAB — BASIC METABOLIC PANEL
Anion gap: 13 (ref 5–15)
BUN: 19 mg/dL (ref 6–20)
CO2: 25 mmol/L (ref 22–32)
Calcium: 9.4 mg/dL (ref 8.9–10.3)
Chloride: 105 mmol/L (ref 98–111)
Creatinine, Ser: 0.93 mg/dL (ref 0.44–1.00)
GFR calc Af Amer: >60 mL/min (ref >60)
GFR calc non Af Amer: >60 mL/min (ref >60)
Glucose, Bld: 93 mg/dL (ref 70–99)
Potassium: 3.9 mmol/L (ref 3.5–5.1)
Sodium: 143 mmol/L (ref 135–145)

## 2019-03-14 LAB — TROPONIN I: Troponin I: <0.03 ng/mL (ref <0.03)

## 2019-03-14 MED ORDER — IOHEXOL 350 MG/ML SOLN
100.0000 mL | Freq: Once | INTRAVENOUS | Status: AC | PRN
  Administered 2019-03-14: 100 mL via INTRAVENOUS

## 2019-03-14 MED ORDER — SODIUM CHLORIDE 0.9% FLUSH
3.0000 mL | Freq: Once | INTRAVENOUS | Status: AC
  Administered 2019-03-14: 3 mL via INTRAVENOUS

## 2019-03-14 NOTE — ED Notes (Signed)
Patient transported to CT 

## 2019-03-14 NOTE — ED Provider Notes (Signed)
Emergency Department Provider Note   I have reviewed the triage vital signs and the nursing notes.   HISTORY  Chief Complaint Headache and Mouth Injury   HPI Denise Shelton is a 58 y.o. female with PMH of HTN and migraine HA presents to the emergency department evaluation of left-sided headache with pain radiating to the left shoulder and jaw.  Patient reports migraine headache yesterday typical of her migraines.  She developed some blurry vision in the left eye which is also typical for her.  She states that this headache resolved after some rest.  Today, she woke up without headache but developed a "cramping" pain in the left side of the head which radiated and behind the left ear.  She began experiencing some soreness in the left jaw and shoulder.  She states that the severe, cramping type pain lasted for only "a few moments" but has had some residual soreness.  She feels some tingling in the jaw but denies specific numbness or weakness.  No speech changes.  No vision changes with the headache today.  No symptoms in the left arm or leg.    Past Medical History:  Diagnosis Date   Hypertension     There are no active problems to display for this patient.   Past Surgical History:  Procedure Laterality Date   CESAREAN SECTION      Allergies Patient has no active allergies.  Family History  Problem Relation Age of Onset   Diabetes Mother    Hypertension Mother    Diabetes Father    Hypertension Father    Hyperlipidemia Father     Social History Social History   Tobacco Use   Smoking status: Never Smoker  Substance Use Topics   Alcohol use: Not on file   Drug use: Not on file    Review of Systems  Constitutional: No fever/chills Eyes: Blurry vision yesterday, resolved.  ENT: No sore throat. Positive tingling in the left jaw.  Cardiovascular: Denies chest pain. Respiratory: Denies shortness of breath. Gastrointestinal: No abdominal pain.  No nausea,  no vomiting.  No diarrhea.  No constipation. Genitourinary: Negative for dysuria. Musculoskeletal: Negative for back pain. Positive left shoulder soreness.  Skin: Negative for rash.  Neurological: Negative for focal weakness or numbness. Positive HA.   10-point ROS otherwise negative.  ____________________________________________   PHYSICAL EXAM:  VITAL SIGNS: ED Triage Vitals  Enc Vitals Group     BP 03/14/19 1656 (!) 180/106     Pulse Rate 03/14/19 1656 84     Resp 03/14/19 1656 16     Temp 03/14/19 1656 97.7 F (36.5 C)     Temp Source 03/14/19 1656 Oral     SpO2 03/14/19 1656 99 %     Pain Score 03/14/19 1715 5   Constitutional: Alert and oriented. Well appearing and in no acute distress. Eyes: Conjunctivae are normal. PERRL. EOMI. Head: Atraumatic. Mild tenderness over the left temporal scalp.  Nose: No congestion/rhinnorhea. Mouth/Throat: Mucous membranes are moist.  Neck: No stridor.   Cardiovascular: Normal rate, regular rhythm. Good peripheral circulation. Grossly normal heart sounds.   Respiratory: Normal respiratory effort.  No retractions. Lungs CTAB. Gastrointestinal: Soft and nontender. No distention.  Musculoskeletal: No lower extremity tenderness nor edema. No gross deformities of extremities. Neurologic:  Normal speech and language. No gross focal neurologic deficits are appreciated. No pronator drift. Skin:  Skin is warm, dry and intact. No rash noted.  ____________________________________________   LABS (all labs ordered are  listed, but only abnormal results are displayed)  Labs Reviewed  SEDIMENTATION RATE - Abnormal; Notable for the following components:      Result Value   Sed Rate 30 (*)    All other components within normal limits  BASIC METABOLIC PANEL  CBC  TROPONIN I  I-STAT BETA HCG BLOOD, ED ([facility], [facility], [facility] ONLY)   ____________________________________________  EKG  Sinus rhythm. Normal axis. Narrow QRS. No ST elevation or depression.  No STEMI. Images not crossing from MUSE.  ____________________________________________  RADIOLOGY  Ct Angio Head W Or Wo Contrast  Result Date: 03/14/2019 CLINICAL DATA:  Headache and blurred vision EXAM: CT ANGIOGRAPHY HEAD AND NECK TECHNIQUE: Multidetector CT imaging of the head and neck was performed using the standard protocol during bolus administration of intravenous contrast. Multiplanar CT image reconstructions and MIPs were obtained to evaluate the vascular anatomy. Carotid stenosis measurements (when applicable) are obtained utilizing NASCET criteria, using the distal internal carotid diameter as the denominator. CONTRAST:  130m OMNIPAQUE IOHEXOL 350 MG/ML SOLN COMPARISON:  Head CT 03/14/2019 FINDINGS: CTA NECK FINDINGS SKELETON: There is no bony spinal canal stenosis. No lytic or blastic lesion. OTHER NECK: Normal pharynx, larynx and major salivary glands. No cervical lymphadenopathy. Unremarkable thyroid gland. UPPER CHEST: No pneumothorax or pleural effusion. No nodules or masses. AORTIC ARCH: There is no calcific atherosclerosis of the aortic arch. There is no aneurysm, dissection or hemodynamically significant stenosis of the visualized ascending aorta and aortic arch. Conventional 3 vessel aortic branching pattern. The visualized proximal subclavian arteries are widely patent. RIGHT CAROTID SYSTEM: --Common carotid artery: Widely patent origin without common carotid artery dissection or aneurysm. --Internal carotid artery: Normal without aneurysm, dissection or stenosis. --External carotid artery: No acute abnormality. LEFT CAROTID SYSTEM: --Common carotid artery: Widely patent origin without common carotid artery dissection or aneurysm. --Internal carotid artery: Normal without aneurysm, dissection or stenosis. --External carotid artery: No acute abnormality. VERTEBRAL ARTERIES: Left dominant configuration. Both origins are normal. No dissection, occlusion or flow-limiting stenosis to the  vertebrobasilar confluence. CTA HEAD FINDINGS POSTERIOR CIRCULATION: --Vertebral arteries: Normal left dominant configuration of V4 segments. --Posterior inferior cerebellar arteries (PICA): Patent origins from the vertebral arteries. --Anterior inferior cerebellar arteries (AICA): Not clearly visualized, though this is not uncommon. --Basilar artery: Normal. --Superior cerebellar arteries: Normal. --Posterior cerebral arteries (PCA): Normal. There are bilateral posterior communicating arteries (p-comm) that partially supply the PCAs. ANTERIOR CIRCULATION: --Intracranial internal carotid arteries: Normal. --Anterior cerebral arteries (ACA): Normal. Both A1 segments are present. Patent anterior communicating artery (a-comm). --Middle cerebral arteries (MCA): Normal. VENOUS SINUSES: As permitted by contrast timing, patent. ANATOMIC VARIANTS: None Review of the MIP images confirms the above findings. IMPRESSION: Normal CTA of the head and neck. Electronically Signed   By: KUlyses JarredM.D.   On: 03/14/2019 21:43   Dg Chest 2 View  Result Date: 03/14/2019 CLINICAL DATA:  Chest pain EXAM: CHEST - 2 VIEW COMPARISON:  None. FINDINGS: The heart size and mediastinal contours are within normal limits. Both lungs are clear. The visualized skeletal structures are unremarkable. IMPRESSION: No active cardiopulmonary disease. Electronically Signed   By: CFranchot GalloM.D.   On: 03/14/2019 18:52   Ct Head Wo Contrast  Result Date: 03/14/2019 CLINICAL DATA:  Headache, blurred vision. EXAM: CT HEAD WITHOUT CONTRAST TECHNIQUE: Contiguous axial images were obtained from the base of the skull through the vertex without intravenous contrast. COMPARISON:  None. FINDINGS: Brain: Ventricles are within normal limits in size and configuration. There is no mass, hemorrhage,  edema or other evidence of acute parenchymal abnormality. No extra-axial hemorrhage. Vascular: Chronic calcified atherosclerotic changes of the large vessels at  the skull base. No unexpected hyperdense vessel. Skull: Normal. Negative for fracture or focal lesion. Sinuses/Orbits: No acute finding. Other: None. IMPRESSION: Negative head CT. No intracranial mass, hemorrhage or edema. Electronically Signed   By: Franki Cabot M.D.   On: 03/14/2019 19:34   Ct Angio Neck W And/or Wo Contrast  Result Date: 03/14/2019 CLINICAL DATA:  Headache and blurred vision EXAM: CT ANGIOGRAPHY HEAD AND NECK TECHNIQUE: Multidetector CT imaging of the head and neck was performed using the standard protocol during bolus administration of intravenous contrast. Multiplanar CT image reconstructions and MIPs were obtained to evaluate the vascular anatomy. Carotid stenosis measurements (when applicable) are obtained utilizing NASCET criteria, using the distal internal carotid diameter as the denominator. CONTRAST:  121m OMNIPAQUE IOHEXOL 350 MG/ML SOLN COMPARISON:  Head CT 03/14/2019 FINDINGS: CTA NECK FINDINGS SKELETON: There is no bony spinal canal stenosis. No lytic or blastic lesion. OTHER NECK: Normal pharynx, larynx and major salivary glands. No cervical lymphadenopathy. Unremarkable thyroid gland. UPPER CHEST: No pneumothorax or pleural effusion. No nodules or masses. AORTIC ARCH: There is no calcific atherosclerosis of the aortic arch. There is no aneurysm, dissection or hemodynamically significant stenosis of the visualized ascending aorta and aortic arch. Conventional 3 vessel aortic branching pattern. The visualized proximal subclavian arteries are widely patent. RIGHT CAROTID SYSTEM: --Common carotid artery: Widely patent origin without common carotid artery dissection or aneurysm. --Internal carotid artery: Normal without aneurysm, dissection or stenosis. --External carotid artery: No acute abnormality. LEFT CAROTID SYSTEM: --Common carotid artery: Widely patent origin without common carotid artery dissection or aneurysm. --Internal carotid artery: Normal without aneurysm, dissection  or stenosis. --External carotid artery: No acute abnormality. VERTEBRAL ARTERIES: Left dominant configuration. Both origins are normal. No dissection, occlusion or flow-limiting stenosis to the vertebrobasilar confluence. CTA HEAD FINDINGS POSTERIOR CIRCULATION: --Vertebral arteries: Normal left dominant configuration of V4 segments. --Posterior inferior cerebellar arteries (PICA): Patent origins from the vertebral arteries. --Anterior inferior cerebellar arteries (AICA): Not clearly visualized, though this is not uncommon. --Basilar artery: Normal. --Superior cerebellar arteries: Normal. --Posterior cerebral arteries (PCA): Normal. There are bilateral posterior communicating arteries (p-comm) that partially supply the PCAs. ANTERIOR CIRCULATION: --Intracranial internal carotid arteries: Normal. --Anterior cerebral arteries (ACA): Normal. Both A1 segments are present. Patent anterior communicating artery (a-comm). --Middle cerebral arteries (MCA): Normal. VENOUS SINUSES: As permitted by contrast timing, patent. ANATOMIC VARIANTS: None Review of the MIP images confirms the above findings. IMPRESSION: Normal CTA of the head and neck. Electronically Signed   By: KUlyses JarredM.D.   On: 03/14/2019 21:43    ____________________________________________   PROCEDURES  Procedure(s) performed:   Procedures  None  ____________________________________________   INITIAL IMPRESSION / ASSESSMENT AND PLAN / ED COURSE  Pertinent labs & imaging results that were available during my care of the patient were reviewed by me and considered in my medical decision making (see chart for details).   Patient presents to the emergency department for evaluation of left-sided headache which is somewhat atypical.  Patient describes some residual soreness in the left scalp, jaw, shoulder.  No focal neurologic deficit.  Patient with mild tenderness over the temporal scalp.  No vision changes during headache today.  Suspicion  for temporal arteritis is low however do plan for ESR along with a CT scan of the head.  Extremely low suspicion for acute stroke or atypical ACS.  Troponin ordered from  triage is negative.  Labs, CXR, and EKG reviewed. No acute findings.   Mild ESR elevation. Discussed with Dr. Rory Percy with Neurology. No concern for temporal arteritis at that level and patient's age. Recommends CTA of the head and neck with Neurology follow up as an outpatient. Patient feeling improved on reassessment. No CVA concern clinically. CTA negative. Plan for discharge with f/u plan as discussed.  ____________________________________________  FINAL CLINICAL IMPRESSION(S) / ED DIAGNOSES  Final diagnoses:  Acute nonintractable headache, unspecified headache type    MEDICATIONS GIVEN DURING THIS VISIT:  Medications  sodium chloride flush (NS) 0.9 % injection 3 mL (3 mLs Intravenous Given 03/14/19 2116)  iohexol (OMNIPAQUE) 350 MG/ML injection 100 mL (100 mLs Intravenous Contrast Given 03/14/19 2115)    Note:  This document was prepared using Dragon voice recognition software and may include unintentional dictation errors.  Nanda Quinton, MD Emergency Medicine    Deonna Krummel, [facility], MD 03/14/19 [phone]

## 2019-03-14 NOTE — ED Notes (Signed)
Patient transported to X-ray 

## 2019-03-14 NOTE — ED Triage Notes (Signed)
Pt states yesterday she developed a migraine first one in over 1 year pt states she had a bad headache with blurry vision that resolved after laying in the dark. Pt states about 2 hours ago she got bad in the left side of her head that radiated down into left and left shoulder. Pt states the pain is better now but has "bunring pain" in left jaw and left shoulder.

## 2019-03-14 NOTE — Discharge Instructions (Signed)

## 2019-03-31 ENCOUNTER — Other Ambulatory Visit: Payer: Self-pay

## 2019-03-31 ENCOUNTER — Ambulatory Visit (INDEPENDENT_AMBULATORY_CARE_PROVIDER_SITE_OTHER): Payer: 59 | Admitting: Neurology

## 2019-03-31 DIAGNOSIS — R519 Headache, unspecified: Secondary | ICD-10-CM | POA: Insufficient documentation

## 2019-03-31 DIAGNOSIS — R51 Headache: Secondary | ICD-10-CM

## 2019-03-31 NOTE — Progress Notes (Signed)
PATIENT: Denise Shelton DOB: 12/29/1960  Virtual Visit via video  I connected with Denise Shelton on 03/31/19 at  by video and verified that I am speaking with the correct person using two identifiers.   I discussed the limitations, risks, security and privacy concerns of performing an evaluation and management service by video and the availability of in person appointments. I also discussed with the patient that there may be a patient responsible charge related to this service. The patient expressed understanding and agreed to proceed.  HISTORICAL  Denise Shelton is a 58 year old female, seen in request by emergency room for evaluation of headaches  I have reviewed and summarized the emergency room presentation on March 14, 2019, she reported history of hypertension, migraine headaches, presented to emergency room for left-sided headaches, radiating pain to left jaw and shoulder,  She had her typical migraine on March 12, 2018, preceded by blurry vision on the left side, lateralized severe pounding headache with light noise sensitivity, nauseous,  She had long history of migraine headaches, her typical migraine lateralized retro-orbital area headaches, only happen couple times each year, says the left side headache, on March 14, 2019, she also experienced sharp radiating pain to the left temporal region, through her left ear, bilateral lower extremity muscle cramping, numbness in her lower jaw, intermittent lasting for 6 hours  Since ED presentation, she had some recurrent minor degree low of lateralized headache, sometimes radiating pain to left shoulder  Laboratory evaluations showed mild elevated ESR 30, normal troponin, CBC, BMP  I personally reviewed CT head without contrast was normal, CT angiogram of head and neck showed no aneurysm, no significant large vessel disease.    Observations/Objective: I have reviewed problem lists, medications, allergies.  She is awake alert oriented  to history taking care of conversation  Assessment and Plan: Chronic migraine headaches  Repeat ESR C-reactive protein to rule out temporal arteritis  Her headache has a lot of migraine features, she may continue NSAIDs as needed  I also suggested preventive medication such as magnesium oxide 400 mg twice a day, riboflavin 100 mg twice a day  Follow Up Instructions:  3 months    I discussed the assessment and treatment plan with the patient. The patient was provided an opportunity to ask questions and all were answered. The patient agreed with the plan and demonstrated an understanding of the instructions.   The patient was advised to call back or seek an in-person evaluation if the symptoms worsen or if the condition fails to improve as anticipated.  I provided 30 minutes of non-face-to-face time during this encounter.  REVIEW OF SYSTEMS: Full 14 system review of systems performed and notable only for as above All other review of systems were negative.  ALLERGIES: No Active Allergies  HOME MEDICATIONS: Current Outpatient Medications  Medication Sig Dispense Refill  . Naproxen Sodium (ALEVE PO) Take by mouth.     No current facility-administered medications for this visit.     PAST MEDICAL HISTORY: Past Medical History:  Diagnosis Date  . Hypertension     PAST SURGICAL HISTORY: Past Surgical History:  Procedure Laterality Date  . CESAREAN SECTION      FAMILY HISTORY: Family History  Problem Relation Age of Onset  . Diabetes Mother   . Hypertension Mother   . Diabetes Father   . Hypertension Father   . Hyperlipidemia Father     SOCIAL HISTORY:   Social History   Socioeconomic History  . Marital  status: Married    Spouse name: Not on file  . Number of children: Not on file  . Years of education: Not on file  . Highest education level: Not on file  Occupational History  . Not on file  Social Needs  . Financial resource strain: Not on file  . Food  insecurity:    Worry: Not on file    Inability: Not on file  . Transportation needs:    Medical: Not on file    Non-medical: Not on file  Tobacco Use  . Smoking status: Never Smoker  Substance and Sexual Activity  . Alcohol use: Not on file  . Drug use: Not on file  . Sexual activity: Not on file  Lifestyle  . Physical activity:    Days per week: Not on file    Minutes per session: Not on file  . Stress: Not on file  Relationships  . Social connections:    Talks on phone: Not on file    Gets together: Not on file    Attends religious service: Not on file    Active member of club or organization: Not on file    Attends meetings of clubs or organizations: Not on file    Relationship status: Not on file  . Intimate partner violence:    Fear of current or ex partner: Not on file    Emotionally abused: Not on file    Physically abused: Not on file    Forced sexual activity: Not on file  Other Topics Concern  . Not on file  Social History Narrative  . Not on file    Marcial Pacas, M.D. Ph.D.  [facility] [address], [place], [place] [address] Ph: [phone] Fax: [phone]  CC: Referring Provider

## 2019-04-04 ENCOUNTER — Encounter: Payer: Self-pay | Admitting: Neurology

## 2019-04-23 MED FILL — AMLODIPINE 2.5 MG TABLET: 2.5 | 90 days supply | Qty: 90 | Fill #0

## 2019-05-21 DIAGNOSIS — Z01419 Encounter for gynecological examination (general) (routine) without abnormal findings: Secondary | ICD-10-CM | POA: Diagnosis not present

## 2019-05-21 DIAGNOSIS — Z1231 Encounter for screening mammogram for malignant neoplasm of breast: Secondary | ICD-10-CM | POA: Diagnosis not present

## 2019-08-05 MED FILL — AMLODIPINE 2.5 MG TABLET: 2.5 | 85 days supply | Qty: 85 | Fill #1

## 2019-10-29 MED FILL — AMLODIPINE 2.5 MG TABLET: 2.5 | 90 days supply | Qty: 90 | Fill #0

## 2020-01-28 MED FILL — AMLODIPINE 2.5 MG TABLET: 2.5 | 90 days supply | Qty: 90 | Fill #1

## 2020-04-13 IMAGING — CT CT HEAD WITHOUT CONTRAST
4 series · 15 of 47 positions shown, 17 images · non-contrast
Comparison: None.

CLINICAL DATA: Headache, blurred vision.

EXAM:
CT HEAD WITHOUT CONTRAST
TECHNIQUE: Contiguous axial images were obtained from the base of the skull
through the vertex without intravenous contrast.

[Series 3: head without · axial · non-contrast · 0.42mm/px · z∈[-84,+21]mm · 7 of 29 slices shown, 9 images]
[im 4/29  brain]
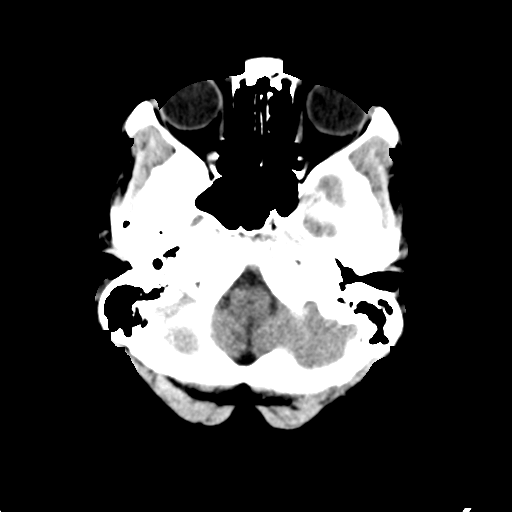
[im 4/29  bone]
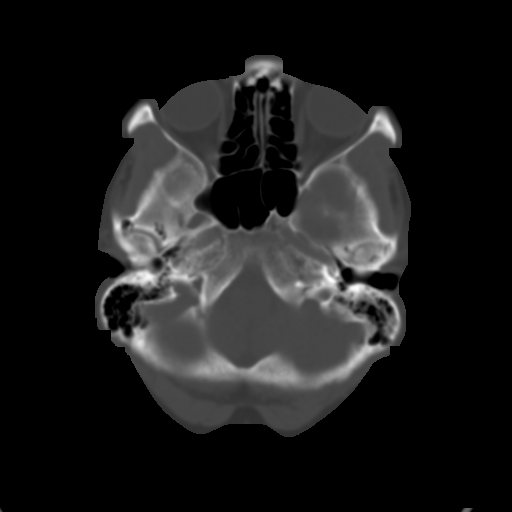
[im 8/29  brain]
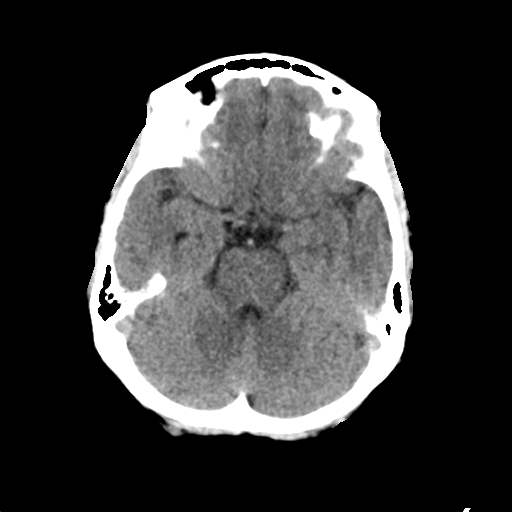
[im 11/29  brain]
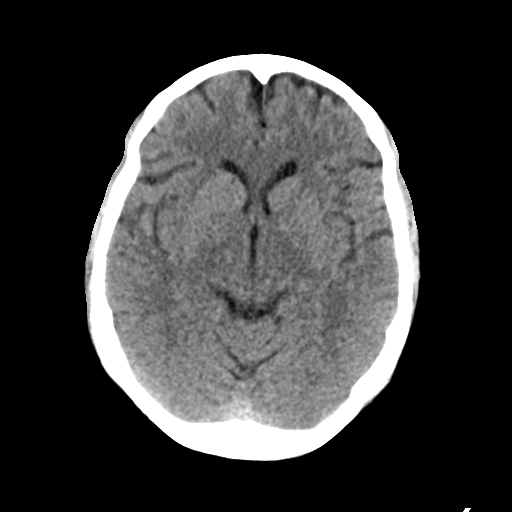
[im 15/29  brain]
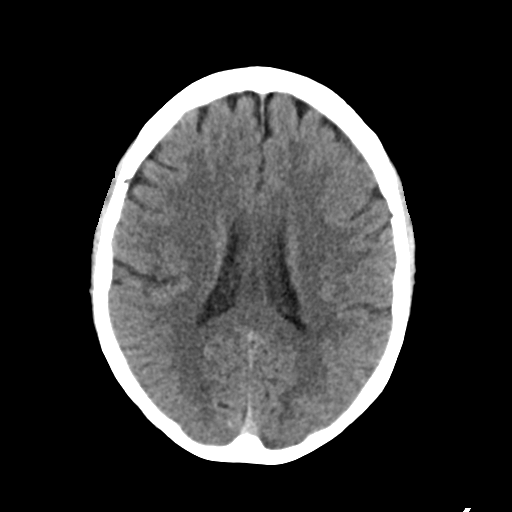
[im 18/29  brain]
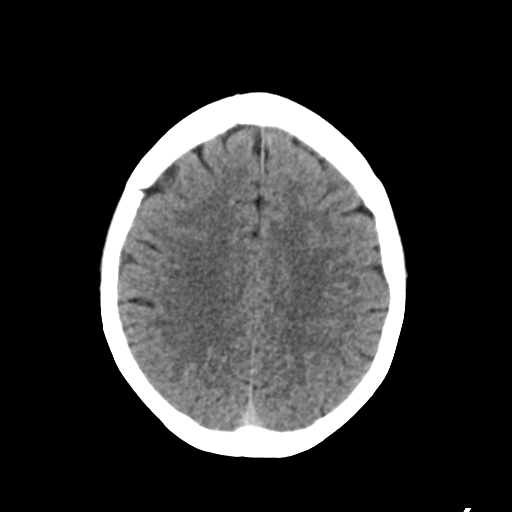
[im 18/29  bone]
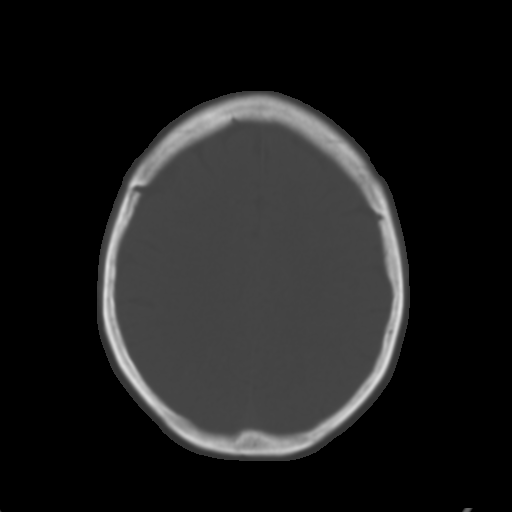
[im 22/29  brain]
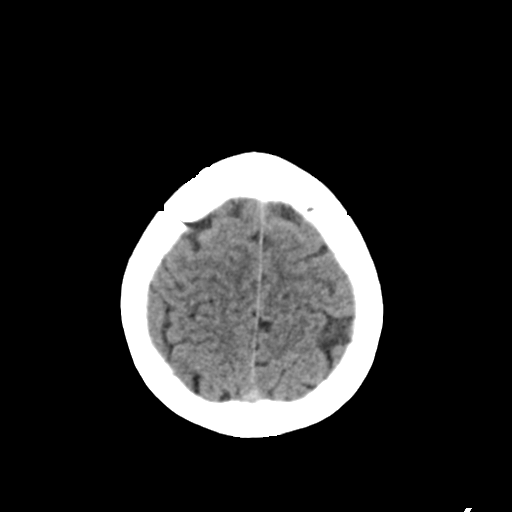
[im 25/29  brain]
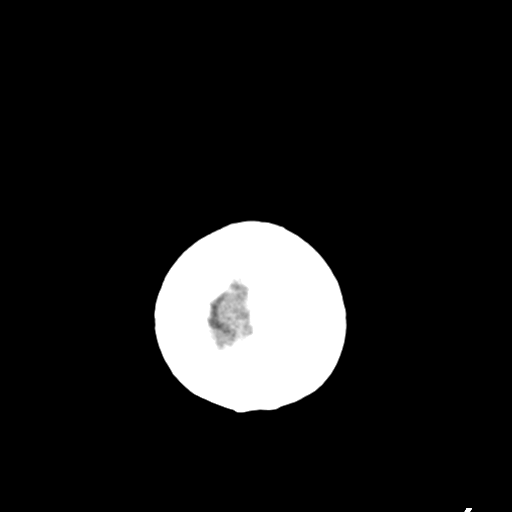

[Series 4: head bone · axial · 0.42mm/px · z∈[-85,-71]mm · 2 of 72 slices shown]
[im 8/72  bone]
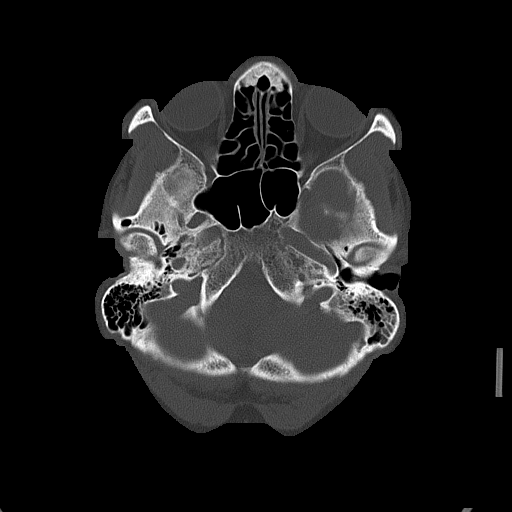
[im 15/72  bone]
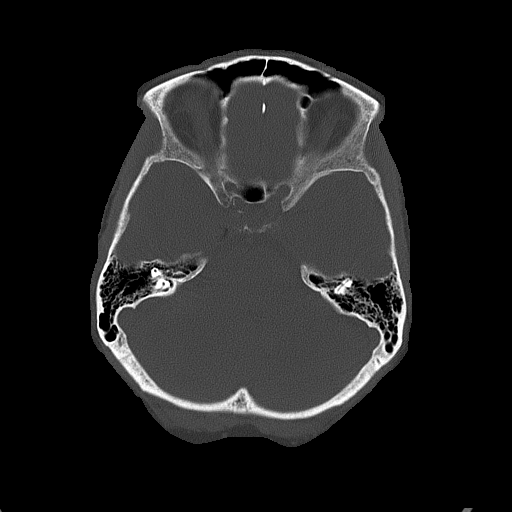

[Series 5: head without cor · coronal · non-contrast · 0.28mm/px · 3 of 67 slices shown]
[im 23/67  brain]
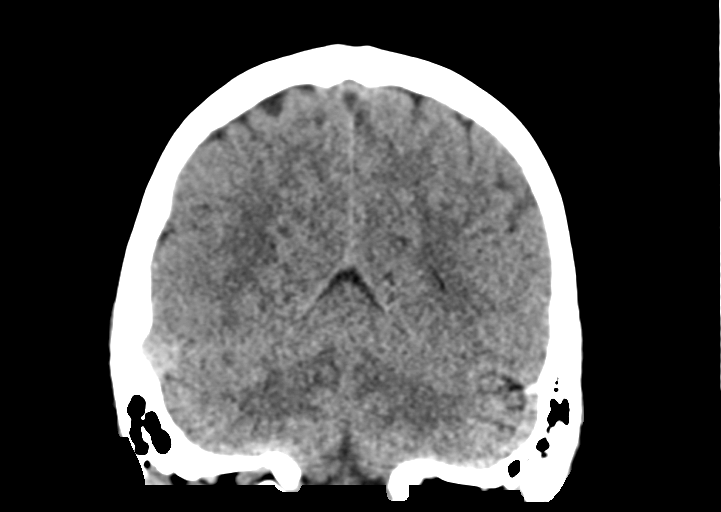
[im 30/67  brain]
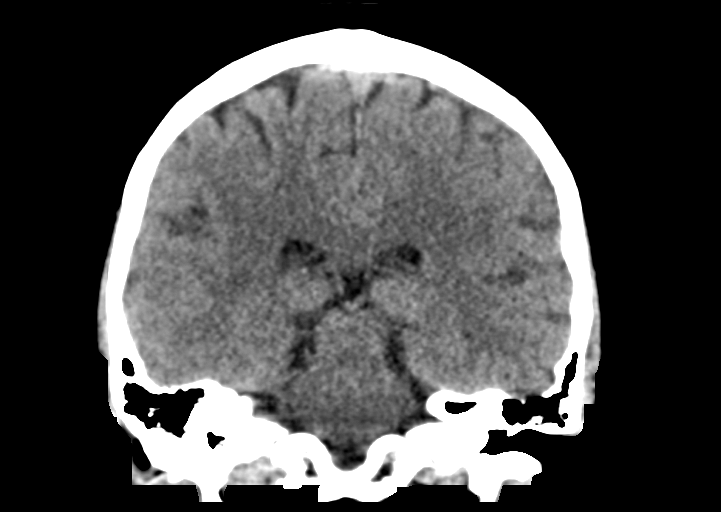
[im 37/67  brain]
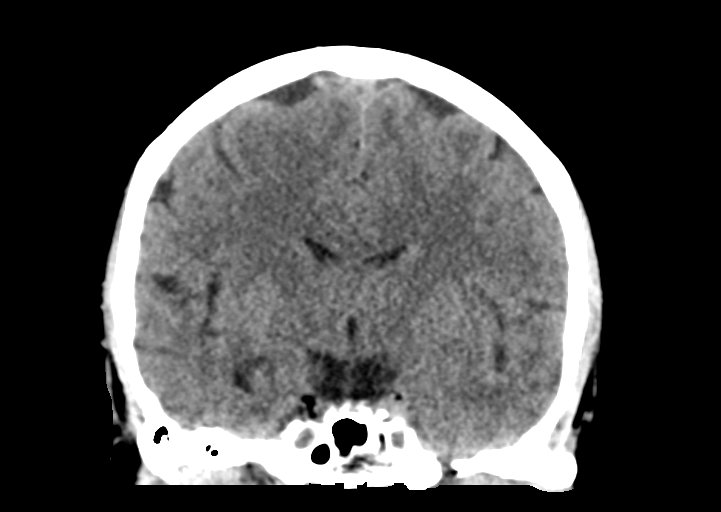

[Series 6: head without sag · sagittal · non-contrast · 0.29mm/px · 3 of 67 slices shown]
[im 23/67  brain]
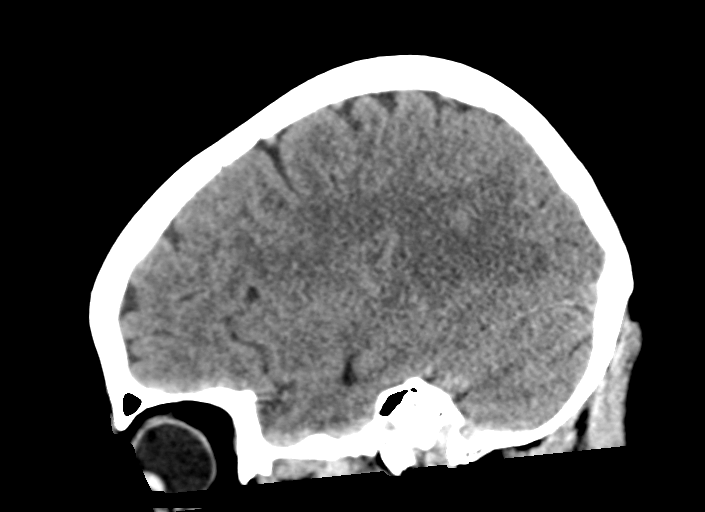
[im 34/67  brain]
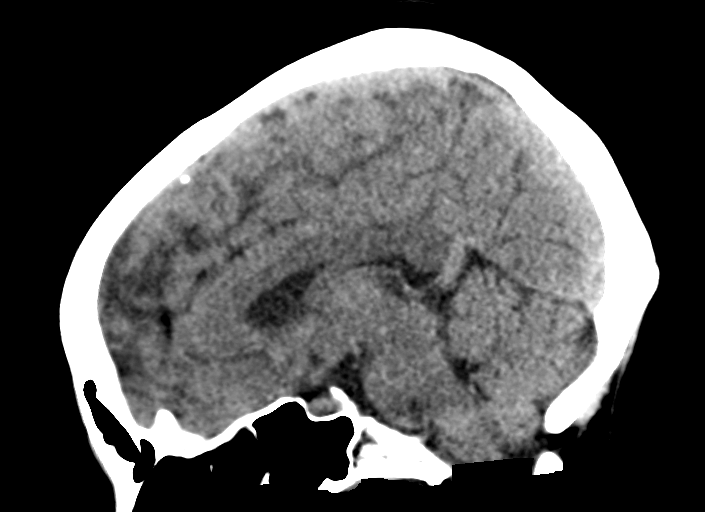
[im 45/67  brain]
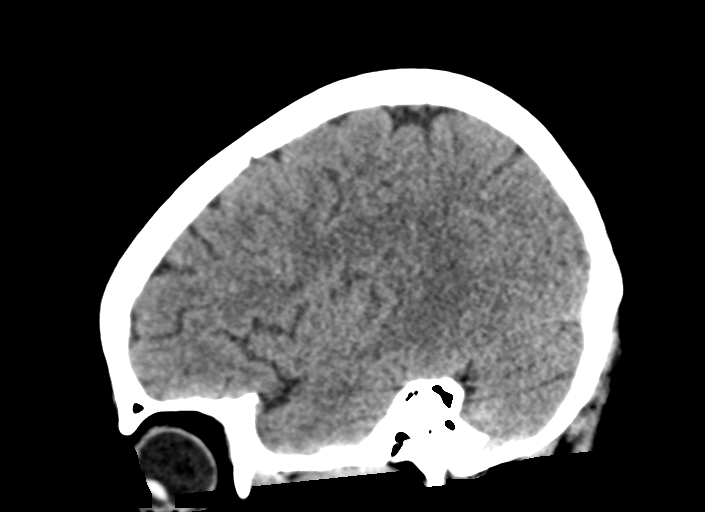

[15 of 47 positions shown; findings below may reference images not displayed]

FINDINGS: Brain: Ventricles are within normal limits in size and
configuration. There is no mass, hemorrhage, edema or other evidence
of acute parenchymal abnormality. No extra-axial hemorrhage.

Vascular: Chronic calcified atherosclerotic changes of the large
vessels at the skull base. No unexpected hyperdense vessel.

Skull: Normal. Negative for fracture or focal lesion.

Sinuses/Orbits: No acute finding.

Other: None.
IMPRESSION: Negative head CT. No intracranial mass, hemorrhage or edema.

## 2020-04-23 DIAGNOSIS — Z0000 Encounter for general adult medical examination without abnormal findings: Secondary | ICD-10-CM | POA: Diagnosis not present

## 2020-04-23 DIAGNOSIS — E7849 Other hyperlipidemia: Secondary | ICD-10-CM | POA: Diagnosis not present

## 2020-04-29 DIAGNOSIS — Z87448 Personal history of other diseases of urinary system: Secondary | ICD-10-CM | POA: Diagnosis not present

## 2020-04-29 DIAGNOSIS — Z23 Encounter for immunization: Secondary | ICD-10-CM | POA: Diagnosis not present

## 2020-04-29 DIAGNOSIS — Z1339 Encounter for screening examination for other mental health and behavioral disorders: Secondary | ICD-10-CM | POA: Diagnosis not present

## 2020-04-29 DIAGNOSIS — N951 Menopausal and female climacteric states: Secondary | ICD-10-CM | POA: Diagnosis not present

## 2020-04-29 DIAGNOSIS — Z1331 Encounter for screening for depression: Secondary | ICD-10-CM | POA: Diagnosis not present

## 2020-04-29 DIAGNOSIS — E785 Hyperlipidemia, unspecified: Secondary | ICD-10-CM | POA: Diagnosis not present

## 2020-04-29 DIAGNOSIS — R82998 Other abnormal findings in urine: Secondary | ICD-10-CM | POA: Diagnosis not present

## 2020-04-29 DIAGNOSIS — I10 Essential (primary) hypertension: Secondary | ICD-10-CM | POA: Diagnosis not present

## 2020-04-29 DIAGNOSIS — J309 Allergic rhinitis, unspecified: Secondary | ICD-10-CM | POA: Diagnosis not present

## 2020-04-29 DIAGNOSIS — Z0000 Encounter for general adult medical examination without abnormal findings: Secondary | ICD-10-CM | POA: Diagnosis not present

## 2020-05-04 MED FILL — AMLODIPINE 2.5 MG TABLET: 2.5 | 30 days supply | Qty: 30 | Fill #2

## 2020-05-06 DIAGNOSIS — Z1212 Encounter for screening for malignant neoplasm of rectum: Secondary | ICD-10-CM | POA: Diagnosis not present

## 2020-06-01 ENCOUNTER — Other Ambulatory Visit (HOSPITAL_COMMUNITY): Payer: Self-pay | Admitting: Internal Medicine

## 2020-06-01 MED FILL — AMLODIPINE 2.5 MG TABLET: 2.5 | 30 days supply | Qty: 30 | Fill #0

## 2020-07-02 MED FILL — AMLODIPINE 2.5 MG TABLET: 2.5 | 30 days supply | Qty: 30 | Fill #1

## 2020-07-06 DIAGNOSIS — Z1231 Encounter for screening mammogram for malignant neoplasm of breast: Secondary | ICD-10-CM | POA: Diagnosis not present

## 2020-07-06 DIAGNOSIS — Z01419 Encounter for gynecological examination (general) (routine) without abnormal findings: Secondary | ICD-10-CM | POA: Diagnosis not present

## 2020-08-04 MED FILL — AMLODIPINE 2.5 MG TABLET: 2.5 | 90 days supply | Qty: 90 | Fill #2

## 2020-08-23 MED FILL — AMOXICILLIN 500 MG CAPSULE: 500 | 7 days supply | Qty: 21 | Fill #0

## 2020-10-13 ENCOUNTER — Other Ambulatory Visit (HOSPITAL_COMMUNITY): Payer: Self-pay | Admitting: Endodontics

## 2020-10-13 MED FILL — AMOXICILLIN 500 MG CAPSULE: 500 | 7 days supply | Qty: 21 | Fill #0

## 2020-11-02 MED FILL — AMLODIPINE BESYLATE 2.5 MG: 2.5 | 60 days supply | Qty: 60 | Fill #3

## 2021-01-09 ENCOUNTER — Other Ambulatory Visit (HOSPITAL_COMMUNITY): Payer: Self-pay | Admitting: Internal Medicine

## 2021-01-10 MED FILL — AMLODIPINE BESYLATE 2.5 MG: 2.5 | 90 days supply | Qty: 90 | Fill #0

## 2021-03-30 ENCOUNTER — Other Ambulatory Visit (HOSPITAL_COMMUNITY): Payer: Self-pay

## 2021-03-30 MED ORDER — AMOXICILLIN 500 MG PO CAPS
500.0000 mg | ORAL_CAPSULE | Freq: Three times a day (TID) | ORAL | 0 refills | Status: DC
  Filled 2021-03-30 (×2): qty 15, 5d supply, fill #0

## 2021-03-31 ENCOUNTER — Other Ambulatory Visit (HOSPITAL_COMMUNITY): Payer: Self-pay

## 2021-04-11 ENCOUNTER — Other Ambulatory Visit (HOSPITAL_COMMUNITY): Payer: Self-pay

## 2021-04-11 MED FILL — Amlodipine Besylate Tab 2.5 MG (Base Equivalent): ORAL | 90 days supply | Qty: 90 | Fill #0 | Status: AC

## 2021-04-25 ENCOUNTER — Other Ambulatory Visit (HOSPITAL_COMMUNITY): Payer: Self-pay

## 2021-04-25 MED ORDER — IBUPROFEN 400 MG PO TABS
400.0000 mg | ORAL_TABLET | ORAL | 0 refills | Status: DC | PRN
  Filled 2021-04-25: qty 30, 5d supply, fill #0

## 2021-04-25 MED ORDER — DEXAMETHASONE 4 MG PO TABS
4.0000 mg | ORAL_TABLET | Freq: Two times a day (BID) | ORAL | 0 refills | Status: DC
  Filled 2021-04-25: qty 9, 4d supply, fill #0

## 2021-04-25 MED ORDER — CHLORHEXIDINE GLUCONATE 0.12 % MT SOLN
OROMUCOSAL | 0 refills | Status: DC
  Filled 2021-04-25: qty 473, 30d supply, fill #0

## 2021-04-25 MED ORDER — HYDROCODONE-ACETAMINOPHEN 5-325 MG PO TABS
1.0000 | ORAL_TABLET | ORAL | 0 refills | Status: DC | PRN
  Filled 2021-04-25: qty 5, 1d supply, fill #0

## 2021-04-25 MED ORDER — AMOXICILLIN 500 MG PO CAPS
500.0000 mg | ORAL_CAPSULE | Freq: Three times a day (TID) | ORAL | 0 refills | Status: DC
  Filled 2021-04-25: qty 21, 7d supply, fill #0

## 2021-04-25 MED ORDER — AMOXICILLIN 500 MG PO CAPS
500.0000 mg | ORAL_CAPSULE | Freq: Three times a day (TID) | ORAL | 0 refills | Status: DC
  Filled 2021-04-25 (×2): qty 15, 5d supply, fill #0

## 2021-04-29 DIAGNOSIS — E785 Hyperlipidemia, unspecified: Secondary | ICD-10-CM | POA: Diagnosis not present

## 2021-04-29 DIAGNOSIS — Z0000 Encounter for general adult medical examination without abnormal findings: Secondary | ICD-10-CM | POA: Diagnosis not present

## 2021-05-06 ENCOUNTER — Other Ambulatory Visit (HOSPITAL_COMMUNITY): Payer: Self-pay

## 2021-05-06 DIAGNOSIS — D259 Leiomyoma of uterus, unspecified: Secondary | ICD-10-CM | POA: Diagnosis not present

## 2021-05-06 DIAGNOSIS — R82998 Other abnormal findings in urine: Secondary | ICD-10-CM | POA: Diagnosis not present

## 2021-05-06 DIAGNOSIS — E785 Hyperlipidemia, unspecified: Secondary | ICD-10-CM | POA: Diagnosis not present

## 2021-05-06 DIAGNOSIS — Z1212 Encounter for screening for malignant neoplasm of rectum: Secondary | ICD-10-CM | POA: Diagnosis not present

## 2021-05-06 DIAGNOSIS — M2012 Hallux valgus (acquired), left foot: Secondary | ICD-10-CM | POA: Diagnosis not present

## 2021-05-06 DIAGNOSIS — I10 Essential (primary) hypertension: Secondary | ICD-10-CM | POA: Diagnosis not present

## 2021-05-06 DIAGNOSIS — Z0000 Encounter for general adult medical examination without abnormal findings: Secondary | ICD-10-CM | POA: Diagnosis not present

## 2021-05-06 DIAGNOSIS — Z1339 Encounter for screening examination for other mental health and behavioral disorders: Secondary | ICD-10-CM | POA: Diagnosis not present

## 2021-05-06 DIAGNOSIS — Z1331 Encounter for screening for depression: Secondary | ICD-10-CM | POA: Diagnosis not present

## 2021-05-06 DIAGNOSIS — B370 Candidal stomatitis: Secondary | ICD-10-CM | POA: Diagnosis not present

## 2021-05-06 MED ORDER — FLUCONAZOLE 100 MG PO TABS
ORAL_TABLET | ORAL | 0 refills | Status: DC
  Filled 2021-05-06: qty 1, 1d supply, fill #0

## 2021-06-13 ENCOUNTER — Other Ambulatory Visit (HOSPITAL_COMMUNITY): Payer: Self-pay

## 2021-06-13 MED ORDER — AZITHROMYCIN 250 MG PO TABS
ORAL_TABLET | ORAL | 0 refills | Status: DC
  Filled 2021-06-13: qty 6, 5d supply, fill #0

## 2021-06-29 ENCOUNTER — Encounter: Payer: Self-pay | Admitting: Internal Medicine

## 2021-07-01 ENCOUNTER — Other Ambulatory Visit (HOSPITAL_COMMUNITY): Payer: Self-pay

## 2021-07-01 MED ORDER — CARESTART COVID-19 HOME TEST VI KIT
PACK | 0 refills | Status: DC
  Filled 2021-07-01: qty 4, 4d supply, fill #0

## 2021-07-09 ENCOUNTER — Ambulatory Visit
Admission: EM | Admit: 2021-07-09 | Discharge: 2021-07-09 | Disposition: A | Discharge status: Home / Self Care | Payer: 59 | Attending: Urgent Care | Admitting: Urgent Care

## 2021-07-09 ENCOUNTER — Ambulatory Visit (INDEPENDENT_AMBULATORY_CARE_PROVIDER_SITE_OTHER): Payer: 59

## 2021-07-09 ENCOUNTER — Other Ambulatory Visit (HOSPITAL_COMMUNITY): Payer: Self-pay

## 2021-07-09 ENCOUNTER — Other Ambulatory Visit: Payer: Self-pay

## 2021-07-09 ENCOUNTER — Encounter: Payer: Self-pay | Admitting: *Deleted

## 2021-07-09 DIAGNOSIS — R053 Chronic cough: Secondary | ICD-10-CM

## 2021-07-09 DIAGNOSIS — J0181 Other acute recurrent sinusitis: Secondary | ICD-10-CM

## 2021-07-09 DIAGNOSIS — J3089 Other allergic rhinitis: Secondary | ICD-10-CM

## 2021-07-09 MED ORDER — AMOXICILLIN-POT CLAVULANATE 875-125 MG PO TABS
1.0000 | ORAL_TABLET | Freq: Two times a day (BID) | ORAL | 0 refills | Status: DC
  Filled 2021-07-09: qty 14, 7d supply, fill #0

## 2021-07-09 MED ORDER — PREDNISONE 20 MG PO TABS
ORAL_TABLET | ORAL | 0 refills | Status: DC
  Filled 2021-07-09: qty 10, 5d supply, fill #0

## 2021-07-09 NOTE — ED Triage Notes (Signed)
Pt reports persistent cough for weeks. Pt took a Z-pack with relief . Sx's have returned.

## 2021-07-09 NOTE — Discharge Instructions (Addendum)
I suspect that you have an underlying allergic rhinitis that continues to give you persistent issues with your sinuses and a cough.  I highly recommend starting Zyrtec on a daily basis.  Right now we will use 1 more round of antibiotics, Augmentin.  Given your persistent sinus inflammation we will also use an oral prednisone course.  After you are finished with his prednisone course, please start taking Flonase daily to help with your allergic rhinitis.  Follow-up with your regular doctor through an in person office visit so that you can have an evaluation and recheck.

## 2021-07-09 NOTE — ED Provider Notes (Signed)
[place]   MRN: [id number] DOB: 1961-01-13  Subjective:   Denise Shelton is a 60 y.o. female presenting for 1 month history of persistent cough.  She has also had persistent sinus congestion, sinus inflammation, facial pain and throat pain.  She called her regular doctor, Dr. Virgina Jock, was prescribed azithromycin without an office visit for imaging.  Patient completed the course and states that symptoms are unchanged.  Denies history of asthma.  She is not a smoker.  Does not have diabetes.  Chart review from 2022 confirms that she has had amoxicillin in April, May, azithromycin this past July.  No current facility-administered medications for this encounter.  Current Outpatient Medications:    amLODipine (NORVASC) 2.5 MG tablet, TAKE 1 TABLET BY MOUTH ONCE A DAY, Disp: 60 tablet, Rfl: 6   amLODipine (NORVASC) 2.5 MG tablet, TAKE 1 TABLET BY MOUTH ONCE DAILY, Disp: 30 tablet, Rfl: 6   amoxicillin (AMOXIL) 500 MG capsule, TAKE 2 CAPSULES BY MOUTH NOW, THEN TAKE 1 CAPSULE THREE TIMES DAILY UNTIL GONE, Disp: 21 capsule, Rfl: 1   amoxicillin (AMOXIL) 500 MG capsule, Take 1 capsule (500 mg total) by mouth 3 (three) times daily until gone, Disp: 15 capsule, Rfl: 0   amoxicillin (AMOXIL) 500 MG capsule, Take 1 capsule (500 mg total) by mouth 3 (three) times daily until gone, Disp: 21 capsule, Rfl: 0   amoxicillin (AMOXIL) 500 MG capsule, Take 1 capsule (500 mg total) by mouth 3 (three) times daily., Disp: 15 capsule, Rfl: 0   azithromycin (ZITHROMAX) 250 MG tablet, Take 2 tablets by mouth on the first day, then 1 tablet daily for 4 days, Disp: 6 tablet, Rfl: 0   chlorhexidine (PERIDEX) 0.12 % solution, RINSE MOUTH WITH 15ML (1 CAPFUL) FOR 30 SECONDS IN THE AM AND PM AFTER TOOTHBRUSHING. SPIT OUT AFTER RINSING, DO NOT SWALLOW, Disp: 473 mL, Rfl: 0   COVID-19 At Home Antigen Test (CARESTART COVID-19 HOME TEST) KIT, Use as directed within package instructions., Disp: 4 each, Rfl: 0    dexamethasone (DECADRON) 4 MG tablet, TAKE 1 TABLET BY MOUTH TWICE A DAY, Disp: 9 tablet, Rfl: 0   fluconazole (DIFLUCAN) 100 MG tablet, TAKE 1 TABLET BY MOUTH AS A ONE TIME DOSE, Disp: 1 tablet, Rfl: 0   HYDROcodone-acetaminophen (NORCO/VICODIN) 5-325 MG tablet, Take 1 tablet by mouth every 4-6  hours as needed for pain, Disp: 5 tablet, Rfl: 0   ibuprofen (ADVIL) 400 MG tablet, TAKE 1 TABLET EVERY 4 HOURS AS NEEDED FOR PAIN. DO NOT TAKE MORE THAN 6 TABLETS PER DAY., Disp: 30 tablet, Rfl: 0   Naproxen Sodium (ALEVE PO), Take by mouth., Disp: , Rfl:    No Known Allergies  Past Medical History:  Diagnosis Date   Hypertension      Past Surgical History:  Procedure Laterality Date   CESAREAN SECTION      Family History  Problem Relation Age of Onset   Diabetes Mother    Hypertension Mother    Diabetes Father    Hypertension Father    Hyperlipidemia Father     Social History   Tobacco Use   Smoking status: Never   Smokeless tobacco: Never    ROS   Objective:   Vitals: BP 125/82   Pulse 96   Temp 98.3 F (36.8 C)   Resp 20   SpO2 98%   Physical Exam Constitutional:      General: She is not in acute distress.    Appearance: Normal  appearance. She is well-developed. She is not ill-appearing, toxic-appearing or diaphoretic.  HENT:     Head: Normocephalic and atraumatic.     Right Ear: Tympanic membrane and ear canal normal. No drainage or tenderness. No middle ear effusion. Tympanic membrane is not erythematous.     Left Ear: Tympanic membrane and ear canal normal. No drainage or tenderness.  No middle ear effusion. Tympanic membrane is not erythematous.     Nose: Congestion and rhinorrhea present.     Mouth/Throat:     Mouth: Mucous membranes are moist. No oral lesions.     Pharynx: No pharyngeal swelling, oropharyngeal exudate, posterior oropharyngeal erythema or uvula swelling.     Tonsils: No tonsillar exudate or tonsillar abscesses.  Eyes:     Extraocular  Movements: Extraocular movements intact.     Right eye: Normal extraocular motion.     Left eye: Normal extraocular motion.     Conjunctiva/sclera: Conjunctivae normal.     Pupils: Pupils are equal, round, and reactive to light.  Cardiovascular:     Rate and Rhythm: Normal rate and regular rhythm.     Pulses: Normal pulses.     Heart sounds: Normal heart sounds. No murmur heard.   No friction rub. No gallop.  Pulmonary:     Effort: Pulmonary effort is normal. No respiratory distress.     Breath sounds: Normal breath sounds. No stridor. No wheezing, rhonchi or rales.  Musculoskeletal:     Cervical back: Normal range of motion and neck supple.  Lymphadenopathy:     Cervical: No cervical adenopathy.  Skin:    General: Skin is warm and dry.     Findings: No rash.  Neurological:     General: No focal deficit present.     Mental Status: She is alert and oriented to person, place, and time.  Psychiatric:        Mood and Affect: Mood normal.        Behavior: Behavior normal.        Thought Content: Thought content normal.    DG Chest 2 View  Result Date: 07/09/2021 CLINICAL DATA:  Chronic cough. EXAM: CHEST - 2 VIEW COMPARISON:  07/14/2019 FINDINGS: Few densities in the left lower chest are suggestive for scarring or atelectasis. Otherwise, the lungs are clear. No pleural effusions. Degenerative changes in the thoracic spine. Heart and mediastinum are within normal limits. Trachea is midline. IMPRESSION: Few densities in left lower chest. Findings are suggestive for mild atelectasis or scarring. Otherwise, no acute cardiopulmonary disease. Electronically Signed   By: Markus Daft M.D.   On: 07/09/2021 12:25     Assessment and Plan :   PDMP not reviewed this encounter.  1. Other acute recurrent sinusitis   2. Allergic rhinitis due to other allergic trigger, unspecified seasonality     Suspect an underlying chronic allergic rhinitis.  Recommended starting Zyrtec daily now.  Will use an  oral prednisone course to address this.  As she has a secondary sinusitis will use Augmentin.  Once she is done with prednisone course recommended Flonase on a regular basis.  Follow-up with in person office visit with her regular doctor. Counseled patient on potential for adverse effects with medications prescribed/recommended today, ER and return-to-clinic precautions discussed, patient verbalized understanding.    Jaynee Eagles, PA-C 07/09/21 1231

## 2021-07-11 ENCOUNTER — Other Ambulatory Visit (HOSPITAL_COMMUNITY): Payer: Self-pay

## 2021-07-11 MED ORDER — AMLODIPINE BESYLATE 2.5 MG PO TABS
2.5000 mg | ORAL_TABLET | Freq: Every day | ORAL | 11 refills | Status: DC
  Filled 2021-07-11: qty 30, 30d supply, fill #0
  Filled 2021-08-10: qty 30, 30d supply, fill #1
  Filled 2021-09-13: qty 30, 30d supply, fill #2

## 2021-08-10 ENCOUNTER — Other Ambulatory Visit (HOSPITAL_COMMUNITY): Payer: Self-pay

## 2021-09-05 ENCOUNTER — Ambulatory Visit (AMBULATORY_SURGERY_CENTER): Payer: 59

## 2021-09-05 ENCOUNTER — Other Ambulatory Visit (HOSPITAL_COMMUNITY): Payer: Self-pay

## 2021-09-05 ENCOUNTER — Other Ambulatory Visit: Payer: Self-pay

## 2021-09-05 VITALS — Ht 64.5 in | Wt 149.0 lb

## 2021-09-05 DIAGNOSIS — Z1211 Encounter for screening for malignant neoplasm of colon: Secondary | ICD-10-CM

## 2021-09-05 MED ORDER — NA SULFATE-K SULFATE-MG SULF 17.5-3.13-1.6 GM/177ML PO SOLN
1.0000 | ORAL | 0 refills | Status: DC
  Filled 2021-09-05: qty 354, 1d supply, fill #0

## 2021-09-05 NOTE — Progress Notes (Signed)

## 2021-09-06 ENCOUNTER — Encounter: Payer: Self-pay | Admitting: Internal Medicine

## 2021-09-08 ENCOUNTER — Other Ambulatory Visit (HOSPITAL_COMMUNITY): Payer: Self-pay

## 2021-09-13 ENCOUNTER — Other Ambulatory Visit (HOSPITAL_COMMUNITY): Payer: Self-pay

## 2021-09-19 ENCOUNTER — Encounter: Payer: Self-pay | Admitting: Internal Medicine

## 2021-09-19 ENCOUNTER — Ambulatory Visit (AMBULATORY_SURGERY_CENTER): Payer: 59 | Admitting: Internal Medicine

## 2021-09-19 VITALS — BP 118/71 | HR 84 | Temp 98.6°F | Resp 18 | Ht 64.5 in | Wt 149.0 lb

## 2021-09-19 DIAGNOSIS — Z1211 Encounter for screening for malignant neoplasm of colon: Secondary | ICD-10-CM | POA: Diagnosis not present

## 2021-09-19 MED ORDER — SODIUM CHLORIDE 0.9 % IV SOLN: 500.0000 mL | Freq: Once | INTRAVENOUS | Status: DC

## 2021-09-19 NOTE — Progress Notes (Signed)
HISTORY OF PRESENT ILLNESS:  Denise Shelton is a 60 y.o. female who presents today for screening colonoscopy.  Negative index exam 2011.  No active complaints  REVIEW OF SYSTEMS:  All non-GI ROS negative. Past Medical History:  Diagnosis Date   Allergy    Hypertension     Past Surgical History:  Procedure Laterality Date   CESAREAN SECTION     COLONOSCOPY     HYSTEROSCOPY      Social History Denise Shelton  reports that she has never smoked. She has never used smokeless tobacco. She reports current alcohol use. She reports that she does not use drugs.  family history includes Diabetes in her father and mother; Hyperlipidemia in her father; Hypertension in her father and mother.  Allergies  Allergen Reactions   Decadron [Dexamethasone] Nausea Only    Dizziness and nausea    Clindamycin/Lincomycin Rash   Latex Rash   Tramadol Other (See Comments)    dizziness       PHYSICAL EXAMINATION:  Vital signs: BP 116/75   Pulse 84   Temp 98.6 F (37 C)   Resp 18   Ht 5' 4.5" (1.638 m)   Wt 149 lb (67.6 kg)   SpO2 100%   BMI 25.18 kg/m  General: Well-developed, well-nourished, no acute distress HEENT: Sclerae are anicteric, conjunctiva pink. Oral mucosa intact Lungs: Clear Heart: Regular Abdomen: soft, nontender, nondistended, no obvious ascites, no peritoneal signs, normal bowel sounds. No organomegaly. Extremities: No edema Psychiatric: alert and oriented x3. Cooperative     ASSESSMENT:  1.  Average risk for colorectal neoplasia   PLAN:   1.  Colonoscopy

## 2021-09-19 NOTE — Progress Notes (Signed)
Pt's states no medical or surgical changes since previsit or office visit.   Check-in-AM  V/S-CW

## 2021-09-19 NOTE — Progress Notes (Signed)
A/ox3, pleased with MAC, report to RN 

## 2021-09-19 NOTE — Op Note (Signed)
[place] Patient Name: Denise Shelton Procedure Date: 09/19/2021 2:50 PM MRN: [id number] Endoscopist: Docia Chuck. Henrene Pastor , MD Age: 60 Referring MD:  Date of Birth: Dec 22, 1960 Gender: Female Account #: [id number] Procedure:                Colonoscopy Indications:              Screening for colorectal malignant neoplasm.                            Negative index exam 2011 Medicines:                Monitored Anesthesia Care Procedure:                Pre-Anesthesia Assessment:                           - Prior to the procedure, a History and Physical                            was performed, and patient medications and                            allergies were reviewed. The patient's tolerance of                            previous anesthesia was also reviewed. The risks                            and benefits of the procedure and the sedation                            options and risks were discussed with the patient.                            All questions were answered, and informed consent                            was obtained. Prior Anticoagulants: The patient has                            taken no previous anticoagulant or antiplatelet                            agents. ASA Grade Assessment: II - A patient with                            mild systemic disease. After reviewing the risks                            and benefits, the patient was deemed in                            satisfactory condition to undergo the procedure.  After obtaining informed consent, the colonoscope                            was passed under direct vision. Throughout the                            procedure, the patient's blood pressure, pulse, and                            oxygen saturations were monitored continuously. The                            CF [id number] #[id number] was introduced through the anus                            and advanced to the the cecum, identified  by                            appendiceal orifice and ileocecal valve. The                            ileocecal valve, appendiceal orifice, and rectum                            were photographed. The quality of the bowel                            preparation was excellent. The colonoscopy was                            performed without difficulty. The patient tolerated                            the procedure well. The bowel preparation used was                            SUPREP via split dose instruction. Scope In: 3:06:08 PM Scope Out: 3:18:41 PM Scope Withdrawal Time: 0 hours 9 minutes 5 seconds  Total Procedure Duration: 0 hours 12 minutes 33 seconds  Findings:                 Multiple diverticula were found in the right colon.                           The exam was otherwise without abnormality on                            direct and retroflexion views. Complications:            No immediate complications. Estimated blood loss:                            None. Estimated Blood Loss:     Estimated blood loss: none. Impression:               -  Diverticulosis in the right colon.                           - The examination was otherwise normal on direct                            and retroflexion views.                           - No specimens collected. Recommendation:           - Repeat colonoscopy in 10 years for screening                            purposes.                           - Patient has a contact number available for                            emergencies. The signs and symptoms of potential                            delayed complications were discussed with the                            patient. Return to normal activities tomorrow.                            Written discharge instructions were provided to the                            patient.                           - Resume previous diet.                           - Continue present medications. Docia Chuck.  Henrene Pastor, MD 09/19/2021 3:24:06 PM This report has been signed electronically.

## 2021-09-19 NOTE — Patient Instructions (Signed)
Resume previous diet and continue current medications. Repeat Colonoscopy in 10 years for surveillance.  YOU HAD AN ENDOSCOPIC PROCEDURE TODAY AT [facility]:   Refer to the procedure report that was given to you for any specific questions about what was found during the examination.  If the procedure report does not answer your questions, please call your gastroenterologist to clarify.  If you requested that your care partner not be given the details of your procedure findings, then the procedure report has been included in a sealed envelope for you to review at your convenience later.  YOU SHOULD EXPECT: Some feelings of bloating in the abdomen. Passage of more gas than usual.  Walking can help get rid of the air that was put into your GI tract during the procedure and reduce the bloating. If you had a lower endoscopy (such as a colonoscopy or flexible sigmoidoscopy) you may notice spotting of blood in your stool or on the toilet paper. If you underwent a bowel prep for your procedure, you may not have a normal bowel movement for a few days.  Please Note:  You might notice some irritation and congestion in your nose or some drainage.  This is from the oxygen used during your procedure.  There is no need for concern and it should clear up in a day or so.  SYMPTOMS TO REPORT IMMEDIATELY:  Following lower endoscopy (colonoscopy or flexible sigmoidoscopy):  Excessive amounts of blood in the stool  Significant tenderness or worsening of abdominal pains  Swelling of the abdomen that is new, acute  Fever of 100F or higher  For urgent or emergent issues, a gastroenterologist can be reached at any hour by calling [phone]. Do not use MyChart messaging for urgent concerns.    DIET:  We do recommend a small meal at first, but then you may proceed to your regular diet.  Drink plenty of fluids but you should avoid alcoholic beverages for 24 hours.  ACTIVITY:  You should plan to  take it easy for the rest of today and you should NOT DRIVE or use heavy machinery until tomorrow (because of the sedation medicines used during the test).    FOLLOW UP: Our staff will call the number listed on your records 48-72 hours following your procedure to check on you and address any questions or concerns that you may have regarding the information given to you following your procedure. If we do not reach you, we will leave a message.  We will attempt to reach you two times.  During this call, we will ask if you have developed any symptoms of COVID 19. If you develop any symptoms (ie: fever, flu-like symptoms, shortness of breath, cough etc.) before then, please call [phone].  If you test positive for Covid 19 in the 2 weeks post procedure, please call and report this information to [place].    If any biopsies were taken you will be contacted by phone or by letter within the next 1-3 weeks.  Please call us at [phone] if you have not heard about the biopsies in 3 weeks.    SIGNATURES/CONFIDENTIALITY: You and/or your care partner have signed paperwork which will be entered into your electronic medical record.  These signatures attest to the fact that that the information above on your After Visit Summary has been reviewed and is understood.  Full responsibility of the confidentiality of this discharge information lies with you and/or your care-partner.

## 2021-09-21 ENCOUNTER — Telehealth: Payer: Self-pay

## 2021-09-21 ENCOUNTER — Telehealth: Payer: Self-pay | Admitting: *Deleted

## 2021-09-21 NOTE — Telephone Encounter (Signed)
Left message on follow up call. 

## 2021-09-21 NOTE — Telephone Encounter (Signed)
  Follow up Call-  Call back number 09/19/2021  Post procedure Call Back phone  # [phone]  Permission to leave phone message Yes  Some recent data might be hidden   LMOM to call back with any questions or concerns.  Also, call back if patient has developed fever, respiratory issues or been dx with COVID or had any family members or close contacts diagnosed since her procedure.

## 2021-10-13 ENCOUNTER — Other Ambulatory Visit (HOSPITAL_COMMUNITY): Payer: Self-pay

## 2021-10-13 MED FILL — Amlodipine Besylate Tab 2.5 MG (Base Equivalent): ORAL | 90 days supply | Qty: 90 | Fill #1 | Status: AC

## 2021-11-08 ENCOUNTER — Other Ambulatory Visit: Payer: Self-pay | Admitting: Obstetrics and Gynecology

## 2021-11-08 DIAGNOSIS — Z1231 Encounter for screening mammogram for malignant neoplasm of breast: Secondary | ICD-10-CM

## 2021-11-10 ENCOUNTER — Other Ambulatory Visit (HOSPITAL_COMMUNITY): Payer: Self-pay

## 2021-11-10 MED ORDER — IBUPROFEN 400 MG PO TABS
400.0000 mg | ORAL_TABLET | ORAL | 0 refills | Status: DC | PRN
  Filled 2021-11-10 – 2021-11-11 (×3): qty 30, 5d supply, fill #0

## 2021-11-10 MED ORDER — DEXAMETHASONE 4 MG PO TABS
4.0000 mg | ORAL_TABLET | Freq: Two times a day (BID) | ORAL | 0 refills | Status: DC
  Filled 2021-11-10: qty 9, 5d supply, fill #0

## 2021-11-10 MED ORDER — AMOXICILLIN 500 MG PO CAPS
500.0000 mg | ORAL_CAPSULE | Freq: Three times a day (TID) | ORAL | 0 refills | Status: DC
  Filled 2021-11-10: qty 15, 5d supply, fill #0

## 2021-11-11 ENCOUNTER — Other Ambulatory Visit (HOSPITAL_COMMUNITY): Payer: Self-pay

## 2021-11-11 MED ORDER — HYDROCODONE-ACETAMINOPHEN 5-325 MG PO TABS
1.0000 | ORAL_TABLET | Freq: Four times a day (QID) | ORAL | 0 refills | Status: DC | PRN
  Filled 2021-11-11: qty 5, 2d supply, fill #0

## 2021-11-11 MED ORDER — AMOXICILLIN 500 MG PO CAPS
500.0000 mg | ORAL_CAPSULE | Freq: Three times a day (TID) | ORAL | 0 refills | Status: DC
  Filled 2021-11-11 (×3): qty 15, 5d supply, fill #0

## 2021-11-11 MED ORDER — CLINDAMYCIN HCL 300 MG PO CAPS
300.0000 mg | ORAL_CAPSULE | Freq: Three times a day (TID) | ORAL | 0 refills | Status: DC
  Filled 2021-11-11: qty 21, 7d supply, fill #0

## 2021-11-17 ENCOUNTER — Other Ambulatory Visit (HOSPITAL_COMMUNITY): Payer: Self-pay

## 2021-11-17 DIAGNOSIS — H524 Presbyopia: Secondary | ICD-10-CM | POA: Diagnosis not present

## 2021-11-17 DIAGNOSIS — H02831 Dermatochalasis of right upper eyelid: Secondary | ICD-10-CM | POA: Diagnosis not present

## 2021-11-17 DIAGNOSIS — H02834 Dermatochalasis of left upper eyelid: Secondary | ICD-10-CM | POA: Diagnosis not present

## 2021-11-17 DIAGNOSIS — H35033 Hypertensive retinopathy, bilateral: Secondary | ICD-10-CM | POA: Diagnosis not present

## 2021-11-17 DIAGNOSIS — H25813 Combined forms of age-related cataract, bilateral: Secondary | ICD-10-CM | POA: Diagnosis not present

## 2021-11-22 DIAGNOSIS — Z01419 Encounter for gynecological examination (general) (routine) without abnormal findings: Secondary | ICD-10-CM | POA: Diagnosis not present

## 2021-12-08 ENCOUNTER — Ambulatory Visit
Admission: RE | Admit: 2021-12-08 | Discharge: 2021-12-08 | Disposition: A | Discharge status: Home / Self Care | Payer: 59 | Source: Ambulatory Visit | Attending: Obstetrics and Gynecology | Admitting: Obstetrics and Gynecology

## 2021-12-08 DIAGNOSIS — Z1231 Encounter for screening mammogram for malignant neoplasm of breast: Secondary | ICD-10-CM | POA: Diagnosis not present

## 2022-01-06 ENCOUNTER — Other Ambulatory Visit (HOSPITAL_COMMUNITY): Payer: Self-pay

## 2022-01-06 MED ORDER — LAGEVRIO 200 MG PO CAPS
ORAL_CAPSULE | ORAL | 0 refills | Status: DC
  Filled 2022-01-06: qty 40, 5d supply, fill #0

## 2022-01-13 ENCOUNTER — Other Ambulatory Visit (HOSPITAL_COMMUNITY): Payer: Self-pay

## 2022-01-16 ENCOUNTER — Other Ambulatory Visit (HOSPITAL_COMMUNITY): Payer: Self-pay

## 2022-01-17 ENCOUNTER — Other Ambulatory Visit (HOSPITAL_COMMUNITY): Payer: Self-pay

## 2022-01-17 MED ORDER — AMLODIPINE BESYLATE 2.5 MG PO TABS
ORAL_TABLET | ORAL | 3 refills | Status: DC
  Filled 2022-01-17: qty 90, 90d supply, fill #0
  Filled 2022-04-23: qty 90, 90d supply, fill #1
  Filled 2022-07-26: qty 90, 90d supply, fill #2
  Filled 2022-10-24: qty 90, 90d supply, fill #3

## 2022-01-25 ENCOUNTER — Other Ambulatory Visit (HOSPITAL_COMMUNITY): Payer: Self-pay

## 2022-04-24 ENCOUNTER — Other Ambulatory Visit (HOSPITAL_COMMUNITY): Payer: Self-pay

## 2022-05-12 DIAGNOSIS — I10 Essential (primary) hypertension: Secondary | ICD-10-CM | POA: Diagnosis not present

## 2022-05-12 DIAGNOSIS — E785 Hyperlipidemia, unspecified: Secondary | ICD-10-CM | POA: Diagnosis not present

## 2022-05-19 DIAGNOSIS — R3121 Asymptomatic microscopic hematuria: Secondary | ICD-10-CM | POA: Diagnosis not present

## 2022-05-19 DIAGNOSIS — I10 Essential (primary) hypertension: Secondary | ICD-10-CM | POA: Diagnosis not present

## 2022-05-19 DIAGNOSIS — Z1331 Encounter for screening for depression: Secondary | ICD-10-CM | POA: Diagnosis not present

## 2022-05-19 DIAGNOSIS — Z1389 Encounter for screening for other disorder: Secondary | ICD-10-CM | POA: Diagnosis not present

## 2022-05-19 DIAGNOSIS — J309 Allergic rhinitis, unspecified: Secondary | ICD-10-CM | POA: Diagnosis not present

## 2022-05-19 DIAGNOSIS — Z23 Encounter for immunization: Secondary | ICD-10-CM | POA: Diagnosis not present

## 2022-05-19 DIAGNOSIS — M2012 Hallux valgus (acquired), left foot: Secondary | ICD-10-CM | POA: Diagnosis not present

## 2022-05-19 DIAGNOSIS — Z0000 Encounter for general adult medical examination without abnormal findings: Secondary | ICD-10-CM | POA: Diagnosis not present

## 2022-05-19 DIAGNOSIS — N951 Menopausal and female climacteric states: Secondary | ICD-10-CM | POA: Diagnosis not present

## 2022-05-19 DIAGNOSIS — E785 Hyperlipidemia, unspecified: Secondary | ICD-10-CM | POA: Diagnosis not present

## 2022-05-19 DIAGNOSIS — R82998 Other abnormal findings in urine: Secondary | ICD-10-CM | POA: Diagnosis not present

## 2022-05-20 DIAGNOSIS — Z0000 Encounter for general adult medical examination without abnormal findings: Secondary | ICD-10-CM | POA: Diagnosis not present

## 2022-07-18 ENCOUNTER — Other Ambulatory Visit (HOSPITAL_COMMUNITY): Payer: Self-pay

## 2022-07-18 MED ORDER — AZITHROMYCIN 250 MG PO TABS
ORAL_TABLET | ORAL | 0 refills | Status: DC
  Filled 2022-07-18: qty 6, 5d supply, fill #0

## 2022-07-26 ENCOUNTER — Other Ambulatory Visit (HOSPITAL_COMMUNITY): Payer: Self-pay

## 2022-10-05 ENCOUNTER — Other Ambulatory Visit: Payer: Self-pay

## 2022-10-24 ENCOUNTER — Other Ambulatory Visit (HOSPITAL_COMMUNITY): Payer: Self-pay

## 2022-11-06 ENCOUNTER — Other Ambulatory Visit (HOSPITAL_COMMUNITY): Payer: Self-pay

## 2022-11-06 MED ORDER — AZITHROMYCIN 250 MG PO TABS
ORAL_TABLET | ORAL | 0 refills | Status: DC
  Filled 2022-11-06: qty 6, 5d supply, fill #0

## 2023-01-19 ENCOUNTER — Other Ambulatory Visit (HOSPITAL_COMMUNITY): Payer: Self-pay

## 2023-01-19 MED ORDER — AMLODIPINE BESYLATE 2.5 MG PO TABS
2.5000 mg | ORAL_TABLET | Freq: Every day | ORAL | 3 refills | Status: DC
  Filled 2023-01-19: qty 90, 90d supply, fill #0
  Filled 2023-04-25: qty 90, 90d supply, fill #1
  Filled 2023-08-02: qty 90, 90d supply, fill #2
  Filled 2023-11-01: qty 90, 90d supply, fill #3

## 2023-01-30 DIAGNOSIS — Z124 Encounter for screening for malignant neoplasm of cervix: Secondary | ICD-10-CM | POA: Diagnosis not present

## 2023-04-26 ENCOUNTER — Other Ambulatory Visit (HOSPITAL_COMMUNITY): Payer: Self-pay

## 2023-04-27 ENCOUNTER — Other Ambulatory Visit (HOSPITAL_COMMUNITY): Payer: Self-pay

## 2023-06-27 DIAGNOSIS — I10 Essential (primary) hypertension: Secondary | ICD-10-CM | POA: Diagnosis not present

## 2023-06-27 DIAGNOSIS — E785 Hyperlipidemia, unspecified: Secondary | ICD-10-CM | POA: Diagnosis not present

## 2023-07-03 DIAGNOSIS — Z0000 Encounter for general adult medical examination without abnormal findings: Secondary | ICD-10-CM | POA: Diagnosis not present

## 2023-07-03 DIAGNOSIS — R82998 Other abnormal findings in urine: Secondary | ICD-10-CM | POA: Diagnosis not present

## 2023-07-03 DIAGNOSIS — N951 Menopausal and female climacteric states: Secondary | ICD-10-CM | POA: Diagnosis not present

## 2023-07-03 DIAGNOSIS — Z1389 Encounter for screening for other disorder: Secondary | ICD-10-CM | POA: Diagnosis not present

## 2023-07-03 DIAGNOSIS — Z1331 Encounter for screening for depression: Secondary | ICD-10-CM | POA: Diagnosis not present

## 2023-07-03 DIAGNOSIS — E785 Hyperlipidemia, unspecified: Secondary | ICD-10-CM | POA: Diagnosis not present

## 2023-07-03 DIAGNOSIS — M2012 Hallux valgus (acquired), left foot: Secondary | ICD-10-CM | POA: Diagnosis not present

## 2023-07-03 DIAGNOSIS — I10 Essential (primary) hypertension: Secondary | ICD-10-CM | POA: Diagnosis not present

## 2023-07-09 ENCOUNTER — Other Ambulatory Visit (HOSPITAL_COMMUNITY): Payer: Self-pay

## 2023-07-09 DIAGNOSIS — H9201 Otalgia, right ear: Secondary | ICD-10-CM | POA: Diagnosis not present

## 2023-07-09 DIAGNOSIS — J309 Allergic rhinitis, unspecified: Secondary | ICD-10-CM | POA: Diagnosis not present

## 2023-07-09 DIAGNOSIS — R0981 Nasal congestion: Secondary | ICD-10-CM | POA: Diagnosis not present

## 2023-07-09 DIAGNOSIS — H6691 Otitis media, unspecified, right ear: Secondary | ICD-10-CM | POA: Diagnosis not present

## 2023-07-09 DIAGNOSIS — H6123 Impacted cerumen, bilateral: Secondary | ICD-10-CM | POA: Diagnosis not present

## 2023-07-09 DIAGNOSIS — I10 Essential (primary) hypertension: Secondary | ICD-10-CM | POA: Diagnosis not present

## 2023-07-09 MED ORDER — AMOXICILLIN-POT CLAVULANATE 875-125 MG PO TABS
1.0000 | ORAL_TABLET | Freq: Two times a day (BID) | ORAL | 0 refills | Status: DC
  Filled 2023-07-09: qty 20, 10d supply, fill #0

## 2023-08-03 ENCOUNTER — Other Ambulatory Visit (HOSPITAL_COMMUNITY): Payer: Self-pay

## 2023-10-13 ENCOUNTER — Other Ambulatory Visit: Payer: Self-pay

## 2023-10-13 ENCOUNTER — Ambulatory Visit
Admission: RE | Admit: 2023-10-13 | Discharge: 2023-10-13 | Disposition: A | Discharge status: Home / Self Care | Payer: 59 | Source: Ambulatory Visit | Attending: Internal Medicine | Admitting: Internal Medicine

## 2023-10-13 VITALS — BP 141/82 | HR 82 | Temp 97.9°F | Resp 18

## 2023-10-13 DIAGNOSIS — J0100 Acute maxillary sinusitis, unspecified: Secondary | ICD-10-CM | POA: Diagnosis not present

## 2023-10-13 DIAGNOSIS — H6993 Unspecified Eustachian tube disorder, bilateral: Secondary | ICD-10-CM

## 2023-10-13 DIAGNOSIS — R42 Dizziness and giddiness: Secondary | ICD-10-CM

## 2023-10-13 MED ORDER — MECLIZINE HCL 12.5 MG PO TABS: 12.5000 mg | ORAL_TABLET | Freq: Three times a day (TID) | ORAL | 0 refills | Status: DC | PRN

## 2023-10-13 MED ORDER — AMOXICILLIN-POT CLAVULANATE 875-125 MG PO TABS: 1.0000 | ORAL_TABLET | Freq: Two times a day (BID) | ORAL | 0 refills | Status: DC

## 2023-10-13 NOTE — Discharge Instructions (Signed)
Increase your Flonase to twice daily.  You may take meclizine as needed for your vertigo/dizziness.  Please note this medication can make you drowsy.  No alcohol or driving while you take this medication.  Stay hydrated.  A provisional prescription for Augmentin has been provided.  Please do not take unless your symptoms do not improve or worsen over the next 2 to 3 days.  Please follow-up with your PCP if your symptoms do not improve.  Please go to the ER for any worsening symptoms.  I hope you feel better soon!

## 2023-10-13 NOTE — ED Provider Notes (Signed)
[facility]    CSN: [id number] Arrival date & time: 10/13/23  1343      History   Chief Complaint Chief Complaint  Denise Shelton presents with   Dizziness    Head congestion, vertigo, elevated blood pressure, ear pressure - Entered by Denise Shelton   Facial Pain    HPI COURTENEY Shelton is a 62 y.o. female  presents for evaluation of URI symptoms for 5 days. Denise Shelton reports associated symptoms of ear pain/fullness with vertigo symptoms, headache, sinus pressure/pain with congested head/foggy feeling. Denies N/V/D, fevers, sore throat, cough, body aches, shortness of breath. Denise Shelton does not at have a hx of asthma. Denise Shelton does not have a history of smoking.  Reports no sick contacts.  Pt has taken Flonase OTC for symptoms.  She did take 1 dose of decongestants but stopped as it raised her blood pressure.  Pt has no other concerns at this time.    Dizziness   Past Medical History:  Diagnosis Date   Allergy    Hypertension     Denise Shelton Active Problem List   Diagnosis Date Noted   Nonintractable headache 03/31/2019    Past Surgical History:  Procedure Laterality Date   CESAREAN SECTION     COLONOSCOPY     HYSTEROSCOPY      OB History   No obstetric history on file.      Home Medications    Prior to Admission medications   Medication Sig Start Date End Date Taking? Authorizing Provider  amLODipine (NORVASC) 2.5 MG tablet Take 1 tablet (2.5 mg total) by mouth daily. 01/19/23  Yes   amoxicillin-clavulanate (AUGMENTIN) 875-125 MG tablet Take 1 tablet by mouth every 12 (twelve) hours. 10/15/23  Yes Radford Pax, NP  ELDERBERRY PO Elderberry   Yes [provider]  fluticasone Aleda Grana) 50 MCG/ACT nasal spray fluticasone propionate 50 mcg/actuation nasal spray,suspension instill 2 sprays into each nostril once daily for SINUS DRAINAGE 09/14/16  Yes [provider]  meclizine (ANTIVERT) 12.5 MG tablet Take 1 tablet (12.5 mg total) by mouth 3 (three) times  daily as needed for dizziness. 10/13/23  Yes Radford Pax, NP  Multiple Vitamins-Minerals (HAIR SKIN AND NAILS FORMULA PO) Take by mouth.   Yes [provider]  amLODipine (NORVASC) 2.5 MG tablet TAKE 1 TABLET BY MOUTH ONCE A DAY 01/09/21 01/09/22  Creola Corn, MD  amLODipine (NORVASC) 2.5 MG tablet TAKE 1 TABLET BY MOUTH ONCE DAILY 06/01/20 06/01/21  Creola Corn, MD  Cetirizine HCl 10 MG CAPS Take by mouth.    [provider]  COVID-19 At Home Antigen Test [facility] COVID-19 HOME TEST) KIT Use as directed within package instructions. Denise Shelton not taking: Reported on 09/19/2021 07/01/21   Andrez Grime, [facility]  HYDROcodone-acetaminophen (NORCO/VICODIN) 5-325 MG tablet Take 1 tablet by mouth every 4-6  hours as needed for pain Denise Shelton not taking: Reported on 09/19/2021 04/25/21     HYDROcodone-acetaminophen (NORCO/VICODIN) 5-325 MG tablet Take 1 tablet by mouth every 6 (six) hours as needed. 11/11/21     ibuprofen (ADVIL) 400 MG tablet TAKE 1 TABLET EVERY 4 HOURS AS NEEDED FOR PAIN. DO NOT TAKE MORE THAN 6 TABLETS PER DAY. Denise Shelton not taking: Reported on 09/19/2021 04/25/21     ibuprofen (ADVIL) 400 MG tablet Take 1 tablet by mouth every 4 hours as needed for pain. No more than 6 tablets per day 11/10/21     ibuprofen (ADVIL) 800 MG tablet ibuprofen 800 mg tablet  take 1 tablet by  mouth every 6 to 8 hours if needed for pain Denise Shelton not taking: Reported on 09/19/2021    [provider]  molnupiravir EUA (LAGEVRIO) 200 MG CAPS capsule Take 4 capsules by mouth every 12 hours for 5 days 01/06/22     molnupiravir EUA (LAGEVRIO) 200 MG CAPS capsule Take 4 capsules by mouth every 12 hrs for 5 days 01/06/22     Naproxen Sodium (ALEVE PO) Take by mouth. Denise Shelton not taking: Reported on 09/19/2021    [provider]  predniSONE (DELTASONE) 20 MG tablet Take 2 tablets daily with breakfast. 07/09/21   Wallis Bamberg, PA-C    Family History Family History  Problem Relation Age of Onset    Diabetes Mother    Hypertension Mother    Diabetes Father    Hypertension Father    Hyperlipidemia Father    Colon cancer Neg Hx    Colon polyps Neg Hx    Esophageal cancer Neg Hx    Rectal cancer Neg Hx    Stomach cancer Neg Hx    Breast cancer Neg Hx     Social History Social History   Tobacco Use   Smoking status: Never   Smokeless tobacco: Never  Vaping Use   Vaping status: Never Used  Substance Use Topics   Alcohol use: Yes    Comment: occassionally   Drug use: Never     Allergies   Decadron [dexamethasone], Clindamycin/lincomycin, Latex, and Tramadol   Review of Systems Review of Systems  HENT:  Positive for congestion, ear pain, sinus pressure and sinus pain.   Neurological:  Positive for dizziness.     Physical Exam Triage Vital Signs ED Triage Vitals  Encounter Vitals Group     BP 10/13/23 1400 (!) 141/82     Systolic BP Percentile --      Diastolic BP Percentile --      Pulse Rate 10/13/23 1400 82     Resp 10/13/23 1400 18     Temp 10/13/23 1400 97.9 F (36.6 C)     Temp Source 10/13/23 1400 Oral     SpO2 10/13/23 1400 98 %     Weight --      Height --      Head Circumference --      Peak Flow --      Pain Score 10/13/23 1357 4     Pain Loc --      Pain Education --      Exclude from Growth Chart --    No data found.  Updated Vital Signs BP (!) 141/82 (BP Location: Left Arm)   Pulse 82   Temp 97.9 F (36.6 C) (Oral)   Resp 18   SpO2 98%   Visual Acuity Right Eye Distance:   Left Eye Distance:   Bilateral Distance:    Right Eye Near:   Left Eye Near:    Bilateral Near:     Physical Exam Vitals and nursing note reviewed.  Constitutional:      General: She is not in acute distress.    Appearance: She is well-developed. She is not ill-appearing.  HENT:     Head: Normocephalic and atraumatic.     Right Ear: Ear canal normal. A middle ear effusion is present. Tympanic membrane is not erythematous.     Left Ear: Ear canal  normal. A middle ear effusion is present. Tympanic membrane is not erythematous.     Nose: Congestion present.     Right Turbinates: Pale.  Not swollen.     Left Turbinates: Pale. Not swollen.     Right Sinus: Maxillary sinus tenderness and frontal sinus tenderness present.     Left Sinus: Maxillary sinus tenderness and frontal sinus tenderness present.     Mouth/Throat:     Mouth: Mucous membranes are moist.     Pharynx: Oropharynx is clear. Uvula midline. No oropharyngeal exudate or posterior oropharyngeal erythema.     Tonsils: No tonsillar exudate or tonsillar abscesses.  Eyes:     Conjunctiva/sclera: Conjunctivae normal.     Pupils: Pupils are equal, round, and reactive to light.  Cardiovascular:     Rate and Rhythm: Normal rate and regular rhythm.     Heart sounds: Normal heart sounds.  Pulmonary:     Effort: Pulmonary effort is normal.     Breath sounds: Normal breath sounds.  Musculoskeletal:     Cervical back: Normal range of motion and neck supple.  Lymphadenopathy:     Cervical: No cervical adenopathy.  Skin:    General: Skin is warm and dry.  Neurological:     General: No focal deficit present.     Mental Status: She is alert and oriented to person, place, and time.  Psychiatric:        Mood and Affect: Mood normal.        Behavior: Behavior normal.      UC Treatments / Results  Labs (all labs ordered are listed, but only abnormal results are displayed) Labs Reviewed - No data to display  EKG   Radiology No results found.  Procedures Procedures (including critical care time)  Medications Ordered in UC Medications - No data to display  Initial Impression / Assessment and Plan / UC Course  I have reviewed the triage vital signs and the nursing notes.  Pertinent labs & imaging results that were available during my care of the Denise Shelton were reviewed by me and considered in my medical decision making (see chart for details).     Reviewed exam and  symptoms with Denise Shelton.  No red flags.  She declined COVID or flu testing.  Discussed eustachian tube dysfunction/vertigo as well as sinusitis.  She is already taking Flonase advised to increase to twice daily.  May take meclizine as needed for vertigo.  Side effect profile reviewed.  Provisional prescription for Augmentin provided with instruction not to take unless symptoms do not improve or worsen over the next 2 to 3 days and she verbalized understanding.  PCP follow-up if symptoms do not improve.  ER precautions reviewed. Final Clinical Impressions(s) / UC Diagnoses   Final diagnoses:  Dysfunction of both eustachian tubes  Vertigo  Acute maxillary sinusitis, recurrence not specified     Discharge Instructions      Increase your Flonase to twice daily.  You may take meclizine as needed for your vertigo/dizziness.  Please note this medication can make you drowsy.  No alcohol or driving while you take this medication.  Stay hydrated.  A provisional prescription for Augmentin has been provided.  Please do not take unless your symptoms do not improve or worsen over the next 2 to 3 days.  Please follow-up with your PCP if your symptoms do not improve.  Please go to the ER for any worsening symptoms.  I hope you feel better soon!    ED Prescriptions     Medication Sig Dispense Auth. Provider   meclizine (ANTIVERT) 12.5 MG tablet Take 1 tablet (12.5 mg total) by mouth 3 (three)  times daily as needed for dizziness. 30 tablet Radford Pax, NP   amoxicillin-clavulanate (AUGMENTIN) 875-125 MG tablet Take 1 tablet by mouth every 12 (twelve) hours. 14 tablet Radford Pax, NP      PDMP not reviewed this encounter.   Radford Pax, NP 10/13/23 [phone]

## 2023-10-13 NOTE — ED Triage Notes (Signed)
Pt reports feeling "off" since Thursday. Reports dizziness, headache, right ear pain, and sinus pressure. States her BP was elevated last night 150s/100s. Her BP was 134/94 earlier today. She took her BP med (amlodipine 2.5) at 0500

## 2023-11-05 ENCOUNTER — Other Ambulatory Visit (HOSPITAL_COMMUNITY): Payer: Self-pay

## 2024-02-03 ENCOUNTER — Other Ambulatory Visit (HOSPITAL_COMMUNITY): Payer: Self-pay

## 2024-02-04 ENCOUNTER — Other Ambulatory Visit (HOSPITAL_COMMUNITY): Payer: Self-pay

## 2024-02-04 MED ORDER — AMLODIPINE BESYLATE 2.5 MG PO TABS
2.5000 mg | ORAL_TABLET | Freq: Every day | ORAL | 3 refills | Status: DC
  Filled 2024-02-04: qty 90, 90d supply, fill #0
  Filled 2024-05-03: qty 90, 90d supply, fill #1

## 2024-07-07 DIAGNOSIS — I10 Essential (primary) hypertension: Secondary | ICD-10-CM | POA: Diagnosis not present

## 2024-07-07 DIAGNOSIS — E785 Hyperlipidemia, unspecified: Secondary | ICD-10-CM | POA: Diagnosis not present

## 2024-07-14 ENCOUNTER — Other Ambulatory Visit (HOSPITAL_BASED_OUTPATIENT_CLINIC_OR_DEPARTMENT_OTHER): Payer: Self-pay

## 2024-07-14 ENCOUNTER — Other Ambulatory Visit (HOSPITAL_COMMUNITY): Payer: Self-pay

## 2024-07-14 DIAGNOSIS — Z1331 Encounter for screening for depression: Secondary | ICD-10-CM | POA: Diagnosis not present

## 2024-07-14 DIAGNOSIS — Z1339 Encounter for screening examination for other mental health and behavioral disorders: Secondary | ICD-10-CM | POA: Diagnosis not present

## 2024-07-14 DIAGNOSIS — N951 Menopausal and female climacteric states: Secondary | ICD-10-CM | POA: Diagnosis not present

## 2024-07-14 DIAGNOSIS — Z0000 Encounter for general adult medical examination without abnormal findings: Secondary | ICD-10-CM | POA: Diagnosis not present

## 2024-07-14 DIAGNOSIS — E785 Hyperlipidemia, unspecified: Secondary | ICD-10-CM | POA: Diagnosis not present

## 2024-07-14 DIAGNOSIS — R82998 Other abnormal findings in urine: Secondary | ICD-10-CM | POA: Diagnosis not present

## 2024-07-14 DIAGNOSIS — I10 Essential (primary) hypertension: Secondary | ICD-10-CM | POA: Diagnosis not present

## 2024-07-14 DIAGNOSIS — J309 Allergic rhinitis, unspecified: Secondary | ICD-10-CM | POA: Diagnosis not present

## 2024-07-14 MED ORDER — AMLODIPINE BESYLATE 5 MG PO TABS
5.0000 mg | ORAL_TABLET | Freq: Every day | ORAL | 3 refills | Status: AC
  Filled 2024-07-14: qty 90, 90d supply, fill #0
  Filled 2024-10-20: qty 90, 90d supply, fill #1

## 2024-08-01 DIAGNOSIS — H25813 Combined forms of age-related cataract, bilateral: Secondary | ICD-10-CM | POA: Diagnosis not present

## 2024-08-01 DIAGNOSIS — H40011 Open angle with borderline findings, low risk, right eye: Secondary | ICD-10-CM | POA: Diagnosis not present

## 2024-08-01 DIAGNOSIS — H18603 Keratoconus, unspecified, bilateral: Secondary | ICD-10-CM | POA: Diagnosis not present

## 2024-08-13 ENCOUNTER — Ambulatory Visit: Admitting: Orthopaedic Surgery

## 2024-10-06 ENCOUNTER — Encounter: Payer: Self-pay | Admitting: Radiology

## 2024-11-18 ENCOUNTER — Ambulatory Visit

## 2024-11-18 ENCOUNTER — Ambulatory Visit: Admitting: Podiatry

## 2024-11-18 ENCOUNTER — Encounter: Payer: Self-pay | Admitting: Podiatry

## 2024-11-18 DIAGNOSIS — D2372 Other benign neoplasm of skin of left lower limb, including hip: Secondary | ICD-10-CM | POA: Diagnosis not present

## 2024-11-18 DIAGNOSIS — M2012 Hallux valgus (acquired), left foot: Secondary | ICD-10-CM

## 2024-11-18 NOTE — Progress Notes (Signed)
 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 female presents with the above complaint.   ROS: Denies fever chills nausea vomiting muscle aches pains calf pain back pain chest pain shortness of breath.  She has had these bunion deformities for quite some time we evaluated her back in 2017 but she had to take care of her mother at that time and was unable to have any surgeries.  She states that this affecting her ability perform her daily activity and she is concerned that her toe is starting to become painful as she points to the second digit of the left foot with a painful callus beneath it.  She states that she has tried shoe gear changes steroid is nonsteroidals nothing seems to really make it better.  Past Medical History:  Diagnosis Date   Allergy    Hypertension    Past Surgical History:  Procedure Laterality Date   CESAREAN SECTION     COLONOSCOPY     HYSTEROSCOPY     Current Medications[1]  Allergies[2] Review of Systems Objective:  There were no vitals filed for this visit.  General: Well developed, nourished, in no acute distress, alert and oriented x3   Dermatological: Skin is warm, dry and supple bilateral. Nails x 10 are well maintained; remaining integument appears unremarkable at this time. There are no open sores, no preulcerative lesions, no rash or signs of infection present.  Vascular: Dorsalis Pedis artery and Posterior Tibial artery pedal pulses are 2/4 bilateral with immedate capillary fill time. Pedal hair growth present. No varicosities and no lower extremity edema present bilateral.   Neruologic: Grossly intact via light touch bilateral. Vibratory intact via tuning fork  bilateral. Protective threshold with Semmes Wienstein monofilament intact to all pedal sites bilateral. Patellar and Achilles deep tendon reflexes 2+ bilateral. No Babinski or clonus noted bilateral.   Musculoskeletal: No gross boney pedal deformities bilateral. No pain, crepitus, or limitation noted with foot and ankle range of motion bilateral. Muscular strength 5/5 in all groups tested bilateral.  Reducible hallux abducto valgus deformity though she does have hallux interphalangeal.  Contracted second metatarsal phalangeal joint with mild flexible hammertoe deformity second.  There is no crepitation on range of motion and she has good full range of motion.  Gait: Unassisted, Nonantalgic.    Radiographs:  Radiographs of the left foot taken today demonstrates an osseously mature individual good bone mineralization.  An increase in the first intermetatarsal angle greater than normal value with a hallux abductus angle greater than normal value as well as the hallux interphalangeal angle greater than normal value.  She does have some contracture of the second digit with an elongated plantarflexed second metatarsal left.  No acute findings are identified.  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  Plan: We consented her today for a [place] bunion repair with screw left a tenotomy capsulotomy second digit left foot also debrided her benign skin lesion for her today.  Provided her with information regarding the pros and cons of the surgery as well as the possible  side effects which may include or not limited to postop pain bleeding swelling faction recurrence need for further surgery of correction under correction loss of digit loss limb loss of life.  Dispensed a cam boot discussed a block from the surgery center and the anesthesia group there.     Jakara Blatter T. Baneen Wieseler, DPM    [1]   Current Outpatient Medications:    amLODipine  (NORVASC ) 5 MG tablet, Take 1 tablet (5 mg total) by mouth daily., Disp: 90 tablet, Rfl: 3 [2]  Allergies Allergen Reactions   Decadron  [Dexamethasone ] Nausea Only    Dizziness and nausea    Clindamycin /Lincomycin Rash   Latex Rash   Tramadol Other (See Comments)    dizziness
